# Patient Record
Sex: Male | Born: 1955 | Race: Black or African American | Hispanic: No | Marital: Single | Smoking: Never smoker
Health system: Southern US, Community
[De-identification: ages and names within clinical notes are randomized; demographics above are authoritative.]

## PROBLEM LIST (undated history)

## (undated) DIAGNOSIS — F32A Depression, unspecified: Secondary | ICD-10-CM

## (undated) DIAGNOSIS — D689 Coagulation defect, unspecified: Secondary | ICD-10-CM

## (undated) DIAGNOSIS — K5792 Diverticulitis of intestine, part unspecified, without perforation or abscess without bleeding: Secondary | ICD-10-CM

## (undated) DIAGNOSIS — K219 Gastro-esophageal reflux disease without esophagitis: Secondary | ICD-10-CM

## (undated) DIAGNOSIS — F419 Anxiety disorder, unspecified: Secondary | ICD-10-CM

## (undated) DIAGNOSIS — K649 Unspecified hemorrhoids: Secondary | ICD-10-CM

## (undated) HISTORY — DX: Gastro-esophageal reflux disease without esophagitis: K21.9

## (undated) HISTORY — DX: Anxiety disorder, unspecified: F41.9

## (undated) HISTORY — PX: HAND SURGERY: SHX662

## (undated) HISTORY — DX: Depression, unspecified: F32.A

## (undated) HISTORY — DX: Diverticulitis of intestine, part unspecified, without perforation or abscess without bleeding: K57.92

## (undated) HISTORY — DX: Coagulation defect, unspecified: D68.9

---

## 1988-06-20 HISTORY — PX: LIVER SURGERY: SHX698

## 1992-06-20 HISTORY — PX: COLONOSCOPY: SHX174

## 1998-09-18 ENCOUNTER — Emergency Department (HOSPITAL_COMMUNITY): Admission: EM | Admit: 1998-09-18 | Discharge: 1998-09-18 | Payer: Self-pay | Admitting: Emergency Medicine

## 1998-09-24 ENCOUNTER — Emergency Department (HOSPITAL_COMMUNITY): Admission: EM | Admit: 1998-09-24 | Discharge: 1998-09-24 | Payer: Self-pay | Admitting: Emergency Medicine

## 1999-06-21 HISTORY — PX: COLONOSCOPY: SHX174

## 1999-07-21 ENCOUNTER — Encounter: Admission: RE | Admit: 1999-07-21 | Discharge: 1999-07-21 | Payer: Self-pay | Admitting: Family Medicine

## 1999-08-02 ENCOUNTER — Encounter: Admission: RE | Admit: 1999-08-02 | Discharge: 1999-08-02 | Payer: Self-pay | Admitting: Family Medicine

## 1999-09-02 ENCOUNTER — Ambulatory Visit (HOSPITAL_COMMUNITY): Admit: 1999-09-02 | Discharge: 1999-09-02 | Payer: Self-pay | Admitting: Gastroenterology

## 2000-02-22 ENCOUNTER — Encounter: Admission: RE | Admit: 2000-02-22 | Discharge: 2000-02-22 | Payer: Self-pay | Admitting: Family Medicine

## 2000-10-20 ENCOUNTER — Encounter: Admission: RE | Admit: 2000-10-20 | Discharge: 2000-10-20 | Payer: Self-pay | Admitting: Family Medicine

## 2000-12-19 ENCOUNTER — Encounter: Payer: Self-pay | Admitting: Emergency Medicine

## 2000-12-19 ENCOUNTER — Emergency Department (HOSPITAL_COMMUNITY): Admission: EM | Admit: 2000-12-19 | Discharge: 2000-12-19 | Payer: Self-pay | Admitting: Emergency Medicine

## 2011-06-21 HISTORY — PX: COLONOSCOPY: SHX174

## 2012-07-23 ENCOUNTER — Emergency Department (HOSPITAL_COMMUNITY): Payer: Self-pay

## 2012-07-23 ENCOUNTER — Encounter (HOSPITAL_COMMUNITY): Payer: Self-pay | Admitting: Emergency Medicine

## 2012-07-23 ENCOUNTER — Emergency Department (HOSPITAL_COMMUNITY)
Admission: EM | Admit: 2012-07-23 | Discharge: 2012-07-23 | Disposition: A | Discharge status: Home / Self Care | Payer: Self-pay | Attending: Emergency Medicine | Admitting: Emergency Medicine

## 2012-07-23 DIAGNOSIS — Y929 Unspecified place or not applicable: Secondary | ICD-10-CM | POA: Insufficient documentation

## 2012-07-23 DIAGNOSIS — Y9389 Activity, other specified: Secondary | ICD-10-CM | POA: Insufficient documentation

## 2012-07-23 DIAGNOSIS — S86912A Strain of unspecified muscle(s) and tendon(s) at lower leg level, left leg, initial encounter: Secondary | ICD-10-CM

## 2012-07-23 DIAGNOSIS — W1809XA Striking against other object with subsequent fall, initial encounter: Secondary | ICD-10-CM | POA: Insufficient documentation

## 2012-07-23 DIAGNOSIS — IMO0002 Reserved for concepts with insufficient information to code with codable children: Secondary | ICD-10-CM | POA: Insufficient documentation

## 2012-07-23 MED ORDER — HYDROCODONE-ACETAMINOPHEN 5-325 MG PO TABS: 1.0000 | ORAL_TABLET | Freq: Four times a day (QID) | ORAL | Status: DC | PRN

## 2012-07-23 NOTE — ED Notes (Signed)
Pt c/o left knee pain after hitting knee on wall today when moving a bed

## 2012-07-23 NOTE — Progress Notes (Signed)
Orthopedic Tech Progress Note Patient Details:  Kevin Mckinney 04/20/1956 [id number]  Ortho Devices Type of Ortho Device: Crutches;Knee Immobilizer Ortho Device/Splint Location: (L) LE Ortho Device/Splint Interventions: Application   Jennye Moccasin 07/23/2012, 6:28 PM

## 2012-07-23 NOTE — ED Notes (Signed)
Ortho tech called for orders 

## 2012-07-23 NOTE — ED Provider Notes (Signed)
History  This chart was scribed for Shelda Jakes, MD by Erskine Emery, ED Scribe. This patient was seen in room TR08C/TR08C and the patient's care was started at 16:45.   CSN: [id number]  Arrival date & time 07/23/12  1613   First MD Initiated Contact with Patient 07/23/12 1645      Chief Complaint  Patient presents with  . Knee Pain    (Consider location/radiation/quality/duration/timing/severity/associated sxs/prior treatment) The history is provided by the patient. No language interpreter was used.  Kevin Mckinney is a 57 y.o. male who presents to the Emergency Department complaining of throbbing, 10/10 left lateral knee pain since 8:30am this morning when he was moving a bed down stairs, fell, and hit his left knee on the wall, making a hole in the wall. Pt reports now he can no longer stand or ambulate well on the leg. Pt denies any previous injuries in that area, any knee sweling, or any left foot numbness.  History reviewed. No pertinent past medical history.  History reviewed. No pertinent past surgical history.  History reviewed. No pertinent family history.  History  Substance Use Topics  . Smoking status: Never Smoker   . Smokeless tobacco: Not on file  . Alcohol Use: No      Review of Systems  Constitutional: Negative for fever.  HENT: Negative for congestion.   Eyes: Negative for visual disturbance.  Respiratory: Negative for cough.   Cardiovascular: Negative for leg swelling.  Gastrointestinal: Negative for abdominal pain.  Genitourinary: Negative for dysuria.  Musculoskeletal: Negative for joint swelling.       Left knee pain  Skin: Negative for rash.  Neurological: Negative for numbness.  Psychiatric/Behavioral: Negative for sleep disturbance.  All other systems reviewed and are negative.    Allergies  Review of patient's allergies indicates no known allergies.  Home Medications   Current Outpatient Rx  Name  Route  Sig  Dispense  Refill   . HYDROCODONE-ACETAMINOPHEN 5-325 MG PO TABS   Oral   Take 1-2 tablets by mouth every 6 (six) hours as needed for pain.   15 tablet   0     Triage Vitals: BP 149/86  Pulse 93  Temp 98.1 F (36.7 C) (Oral)  Resp 18  SpO2 100%  Physical Exam  Nursing note and vitals reviewed. Constitutional: He is oriented to person, place, and time. He appears well-developed and well-nourished. No distress.  HENT:  Head: Normocephalic and atraumatic.  Eyes: EOM are normal. Pupils are equal, round, and reactive to light.  Neck: Neck supple. No tracheal deviation present.  Cardiovascular: Normal rate, regular rhythm and normal heart sounds.   Pulmonary/Chest: Effort normal and breath sounds normal. No respiratory distress. He has no wheezes.       Lungs are clear  Abdominal: Soft. Bowel sounds are normal. He exhibits no distension.  Musculoskeletal: Normal range of motion. He exhibits no edema.       No knee effusion. DP pulse 2+ on left. No calf tenderness. Patella is in proprer location, with no dislocation. No medial tenderness, no joint line tenderness, only left lateral knee tenderness.  Neurological: He is alert and oriented to person, place, and time.  Skin: Skin is warm and dry.  Psychiatric: He has a normal mood and affect.    ED Course  Procedures (including critical care time) DIAGNOSTIC STUDIES: Oxygen Saturation is 100% on room air, normal by my interpretation.    COORDINATION OF CARE: 16:56--I evaluated the patient and we  discussed a treatment plan including knee x-ray and knee immobilizer to which the pt agreed.    Labs Reviewed - No data to display Dg Knee Complete 4 Views Left  07/23/2012  *RADIOLOGY REPORT*  Clinical Data: Left knee pain.  LEFT KNEE - COMPLETE 4+ VIEW  Comparison: None.  Findings: Four views of the left knee were obtained.  Negative for acute fracture or dislocation.  No definite joint effusion.  Normal alignment.  Mild degenerative changes in the lateral  knee compartment and patellofemoral compartment.  IMPRESSION: No acute bony abnormality.   Original Report Authenticated By: Richarda Overlie, M.D.      1. Strain of knee and leg, left       MDM  Patient with a strain to the left knee. No evidence of effusion doubt any internal joint injury. May have a the lateral collateral ligament injury or tendon injury. Patient retrieve a knee immobilizer and crutches and followup with orthopedics.      I personally performed the services described in this documentation, which was scribed in my presence. The recorded information has been reviewed and is accurate.     Shelda Jakes, MD 07/23/12 [id number]

## 2015-03-12 ENCOUNTER — Emergency Department (HOSPITAL_COMMUNITY)
Admission: EM | Admit: 2015-03-12 | Discharge: 2015-03-12 | Disposition: A | Discharge status: Home / Self Care | Payer: Self-pay | Attending: Emergency Medicine | Admitting: Emergency Medicine

## 2015-03-12 ENCOUNTER — Encounter (HOSPITAL_COMMUNITY): Payer: Self-pay | Admitting: *Deleted

## 2015-03-12 ENCOUNTER — Emergency Department (HOSPITAL_COMMUNITY): Payer: Self-pay

## 2015-03-12 DIAGNOSIS — R079 Chest pain, unspecified: Secondary | ICD-10-CM | POA: Insufficient documentation

## 2015-03-12 DIAGNOSIS — R0602 Shortness of breath: Secondary | ICD-10-CM | POA: Insufficient documentation

## 2015-03-12 DIAGNOSIS — M79602 Pain in left arm: Secondary | ICD-10-CM | POA: Insufficient documentation

## 2015-03-12 DIAGNOSIS — M546 Pain in thoracic spine: Secondary | ICD-10-CM | POA: Insufficient documentation

## 2015-03-12 LAB — CBC WITH DIFFERENTIAL/PLATELET
Basophils Absolute: 0.1 10*3/uL (ref 0.0–0.1)
Basophils Relative: 1 %
EOS ABS: 0.1 10*3/uL (ref 0.0–0.7)
Eosinophils Relative: 1 %
HCT: 39.4 % (ref 39.0–52.0)
HEMOGLOBIN: 13.1 g/dL (ref 13.0–17.0)
LYMPHS ABS: 2.3 10*3/uL (ref 0.7–4.0)
Lymphocytes Relative: 35 %
MCH: 29 pg (ref 26.0–34.0)
MCHC: 33.2 g/dL (ref 30.0–36.0)
MCV: 87.4 fL (ref 78.0–100.0)
MONO ABS: 0.3 10*3/uL (ref 0.1–1.0)
MONOS PCT: 5 %
NEUTROS PCT: 58 %
Neutro Abs: 3.7 10*3/uL (ref 1.7–7.7)
Platelets: 205 10*3/uL (ref 150–400)
RBC: 4.51 MIL/uL (ref 4.22–5.81)
RDW: 12.8 % (ref 11.5–15.5)
WBC: 6.4 10*3/uL (ref 4.0–10.5)

## 2015-03-12 LAB — COMPREHENSIVE METABOLIC PANEL
ALK PHOS: 74 U/L (ref 38–126)
ALT: 19 U/L (ref 17–63)
ANION GAP: 5 (ref 5–15)
AST: 23 U/L (ref 15–41)
Albumin: 3.5 g/dL (ref 3.5–5.0)
BILIRUBIN TOTAL: 0.4 mg/dL (ref 0.3–1.2)
BUN: 15 mg/dL (ref 6–20)
CALCIUM: 8.6 mg/dL — AB (ref 8.9–10.3)
CO2: 27 mmol/L (ref 22–32)
Chloride: 105 mmol/L (ref 101–111)
Creatinine, Ser: 1.15 mg/dL (ref 0.61–1.24)
GFR calc non Af Amer: >60 mL/min (ref >60)
Glucose, Bld: 104 mg/dL — ABNORMAL HIGH (ref 65–99)
Potassium: 3.7 mmol/L (ref 3.5–5.1)
SODIUM: 137 mmol/L (ref 135–145)
TOTAL PROTEIN: 6.3 g/dL — AB (ref 6.5–8.1)

## 2015-03-12 LAB — LIPASE, BLOOD: Lipase: 43 U/L (ref 22–51)

## 2015-03-12 LAB — I-STAT TROPONIN, ED: Troponin i, poc: 0 ng/mL (ref 0.00–0.08)

## 2015-03-12 LAB — CK: CK TOTAL: 315 U/L (ref 49–397)

## 2015-03-12 LAB — D-DIMER, QUANTITATIVE (NOT AT ARMC)

## 2015-03-12 MED ORDER — HYDROCODONE-ACETAMINOPHEN 5-325 MG PO TABS
2.0000 | ORAL_TABLET | Freq: Four times a day (QID) | ORAL | Status: DC | PRN
Start: 2015-03-12 — End: 2017-07-17

## 2015-03-12 MED ORDER — ASPIRIN 81 MG PO CHEW
324.0000 mg | CHEWABLE_TABLET | Freq: Once | ORAL | Status: AC
  Administered 2015-03-12: 324 mg via ORAL
  Filled 2015-03-12: qty 4

## 2015-03-12 MED ORDER — ACYCLOVIR 800 MG PO TABS: 800.0000 mg | ORAL_TABLET | Freq: Every day | ORAL | Status: DC

## 2015-03-12 MED ORDER — NITROGLYCERIN 0.4 MG SL SUBL: 0.4000 mg | SUBLINGUAL_TABLET | SUBLINGUAL | Status: DC | PRN

## 2015-03-12 MED ORDER — CYCLOBENZAPRINE HCL 10 MG PO TABS: 10.0000 mg | ORAL_TABLET | Freq: Two times a day (BID) | ORAL | Status: DC | PRN

## 2015-03-12 NOTE — ED Notes (Addendum)
Pt in c/o pain to left shoulder blade for the last month, states some times it runs down into his hand, denies chest pain, states at times the pain makes it hard to catch his breath, pain is worse with movement, pain in neck and it hurts to turn his head, no distress noted, denies injury

## 2015-03-12 NOTE — ED Provider Notes (Signed)
CSN: [id number]     Arrival date & time 03/12/15  1418 History   First MD Initiated Contact with Patient 03/12/15 1527     Chief Complaint  Patient presents with  . Shoulder Pain    Patient is a 59 y.o. male presenting with general illness. The history is provided by the patient. No language interpreter was used.  Illness Location:  Shoulder/Chest Quality:  Pain Severity:  Moderate Onset quality:  Gradual Timing:  Intermittent Progression:  Waxing and waning Chronicity:  New Context:  PMHx of HTN (not medicated) with no PCP who presents with shoulder & L sided CP. Onset x3-4 weeks ago. Persistent pain intermittently throughout day. Sharp pain radiating around from mid thoracic back to left lateral chest. Worse with deep inspiration, lifting left arm, and lying down at night. Pain, numbness, & tingling radiating down left arm now.  Patient denies hemoptysis, unilateral lower extremities swelling, recent prolonged car ride or airplane ride or other immobilization, recent trauma or surgery Associated symptoms: chest pain, myalgias and shortness of breath   Associated symptoms: no cough, no diarrhea, no fever, no headaches, no loss of consciousness, no nausea, no rash, no vomiting and no wheezing     History reviewed. No pertinent past medical history. History reviewed. No pertinent past surgical history. History reviewed. No pertinent family history. Social History  Substance Use Topics  . Smoking status: Never Smoker   . Smokeless tobacco: None  . Alcohol Use: No    Review of Systems  Constitutional: Negative for fever and chills.  Respiratory: Positive for shortness of breath. Negative for cough and wheezing.   Cardiovascular: Positive for chest pain. Negative for palpitations and leg swelling.  Gastrointestinal: Negative for nausea, vomiting and diarrhea.  Musculoskeletal: Positive for myalgias, back pain and arthralgias.  Skin: Negative for rash.  Neurological: Positive for  numbness. Negative for dizziness, tremors, seizures, loss of consciousness, syncope, facial asymmetry, speech difficulty, weakness, light-headedness and headaches.  All other systems reviewed and are negative.   Allergies  Review of patient's allergies indicates no known allergies.  Home Medications   Prior to Admission medications   Medication Sig Start Date End Date Taking? Authorizing Provider  acyclovir (ZOVIRAX) 800 MG tablet Take 1 tablet (800 mg total) by mouth 5 (five) times daily. 03/12/15   Mayer Camel, MD  cyclobenzaprine (FLEXERIL) 10 MG tablet Take 1 tablet (10 mg total) by mouth 2 (two) times daily as needed for muscle spasms. 03/12/15   Mayer Camel, MD  HYDROcodone-acetaminophen (NORCO/VICODIN) 5-325 MG per tablet Take 2 tablets by mouth every 6 (six) hours as needed for moderate pain. 03/12/15   Mayer Camel, MD   BP 148/92 mmHg  Pulse 51  Temp(Src) 98.4 F (36.9 C) (Oral)  Resp 16  SpO2 99%   Physical Exam  Constitutional: He is oriented to person, place, and time. He appears well-developed and well-nourished. No distress.  HENT:  Head: Normocephalic and atraumatic.  Eyes: Conjunctivae are normal. Pupils are equal, round, and reactive to light.  Neck: Normal range of motion. Neck supple.  Cardiovascular: Normal rate, regular rhythm and intact distal pulses.   Hypertensive  Pulmonary/Chest: Effort normal and breath sounds normal.  Abdominal: Soft. Bowel sounds are normal. He exhibits no distension. There is no tenderness. There is no rebound.  Musculoskeletal: Normal range of motion. He exhibits tenderness.  Tenderness to palpation of entire left paraspinal muscles from level of left trapezium to just below left rhomboid,  Tenderness to palpation in bandlike distribution  around the left lateral chest  Neurological: He is alert and oriented to person, place, and time. He has normal reflexes. He displays no atrophy, no tremor and normal reflexes. No cranial nerve  deficit or sensory deficit. He exhibits normal muscle tone. He displays no seizure activity. Coordination and gait normal. GCS eye subscore is 4. GCS verbal subscore is 5. GCS motor subscore is 6.  Reflex Scores:      Tricep reflexes are 2+ on the right side and 2+ on the left side.      Bicep reflexes are 2+ on the right side and 2+ on the left side.      Brachioradialis reflexes are 2+ on the right side and 2+ on the left side.      Patellar reflexes are 2+ on the right side and 2+ on the left side.      Achilles reflexes are 2+ on the right side and 2+ on the left side. Skin: Skin is warm. No rash noted. He is diaphoretic.  Nursing note and vitals reviewed.   ED Course  Procedures   Labs Review Labs Reviewed  COMPREHENSIVE METABOLIC PANEL - Abnormal; Notable for the following:    Glucose, Bld 104 (*)    Calcium 8.6 (*)    Total Protein 6.3 (*)    All other components within normal limits  CBC WITH DIFFERENTIAL/PLATELET  LIPASE, BLOOD  D-DIMER, QUANTITATIVE (NOT AT [facility])  CK  I-STAT TROPOININ, ED   Imaging Review Dg Chest 2 View  03/12/2015   CLINICAL DATA:  Left arm pain, mid thoracic pain  EXAM: CHEST  2 VIEW  COMPARISON:  None.  FINDINGS: Cardiomediastinal silhouette is unremarkable. No acute infiltrate or pleural effusion. No pulmonary edema. Degenerative changes mid thoracic spine.  IMPRESSION: No active cardiopulmonary disease.   Electronically Signed   By: Lahoma Crocker M.D.   On: 03/12/2015 16:32   Dg Thoracic Spine 2 View  03/12/2015   CLINICAL DATA:  Left arm pain, mid thoracic spine pain  EXAM: THORACIC SPINE 2 VIEWS  COMPARISON:  None.  FINDINGS: Three views of thoracic spine submitted. No acute fracture or subluxation. Degenerative changes with anterior spurring noted mid thoracic spine. Alignment and vertebral body heights are preserved.  IMPRESSION: No acute fracture or subluxation. Degenerative changes mid thoracic spine.   Electronically Signed   By: Lahoma Crocker M.D.    On: 03/12/2015 16:31   I have personally reviewed and evaluated these images and lab results as part of my medical decision-making.   EKG Interpretation   Date/Time:  Thursday March 12 2015 15:12:37 EDT Ventricular Rate:  65 PR Interval:  156 QRS Duration: 80 QT Interval:  374 QTC Calculation: 388 R Axis:   23 Text Interpretation:  Normal sinus rhythm T wave abnormality, consider  inferior ischemia Abnormal ECG T wave inversion seen in III in 2002, aVF  is new No significant change since last tracing Confirmed by Mingo Amber  MD,  Daniels ([id number]) on 03/12/2015 4:51:26 PM      MDM  Mr. Servantes is a 59 yo male w/ PMHx of HTN (not medicated) with no PCP who presents with shoulder & L sided CP. Onset x3-4 weeks ago. Persistent pain intermittently throughout day. Sharp pain radiating around from mid thoracic back to left lateral chest. Worse with deep inspiration, lifting left arm, and lying down at night. Pain, numbness, & tingling radiating down left arm now. Patient right handed but notes recent increased activity at work with heavy lifting.  Patient denies hemoptysis, unilateral lower extremities swelling, recent prolonged car ride or airplane ride or other immobilization, recent trauma or surgery to either lower extremity, or previous history of DVT/PE.  Elderly male lying in the stretcher in NAD. Afebrile. Not tachycardic. Hypertensive. Breathing well on RA and maintaining saturations without supplemental oxygen. Lungs CTAB. Abd benign. CV RRR. Remainder of examination unremarkable.  Differential including ACS versus PE versus PTX versus muculoskeletal pain versus shingles.  Patient given ASA & NO SL prn ordered. EKG showing questionable elevation in aVL &V2 with T-wave inversion in 3 and aVF. I-STAT troponin undetectable - no need for delta troponin given duration of symptoms. Patient low risk Well's Criteria but cannot undergo PERC rule out given age. D-dimer <0.27. CK 315. CMP & CBC  noncontributory. Two-view x-ray thoracic spine showing no acute fracture or subluxation but degenerative changes with anterior spurring. Chest x-ray showing no acute cardiac pulmonary disease.  Laboratory and imaging results were personally reviewed by myself and used in the medical decision making of this patient's treatment and disposition.  Pain likely related to muscular skeletal origin versus early shingles without dermatologic manifestations yet. Patient would benefit form regular care from PCP given borderline hypertension diagnosed while incarcerated. Patient given resources to set this up. Patient discharged home with regimen for Acyclovir, Flexeril, and Norco.  Pt care discussed with and followed by my attending, Dr. Donnald Garre, MD Pager [phone]  Final diagnoses:  Left sided chest pain  Left-sided thoracic back pain  Left arm pain    Mayer Camel, MD 03/12/15 1759  Evelina Bucy, MD 03/12/15 2326

## 2016-10-13 IMAGING — DX DG CHEST 2V
2 series · 2 of 2 positions shown · non-contrast
Comparison: None.

CLINICAL DATA: Left arm pain, mid thoracic pain

EXAM:
CHEST  2 VIEW

[chest pa]
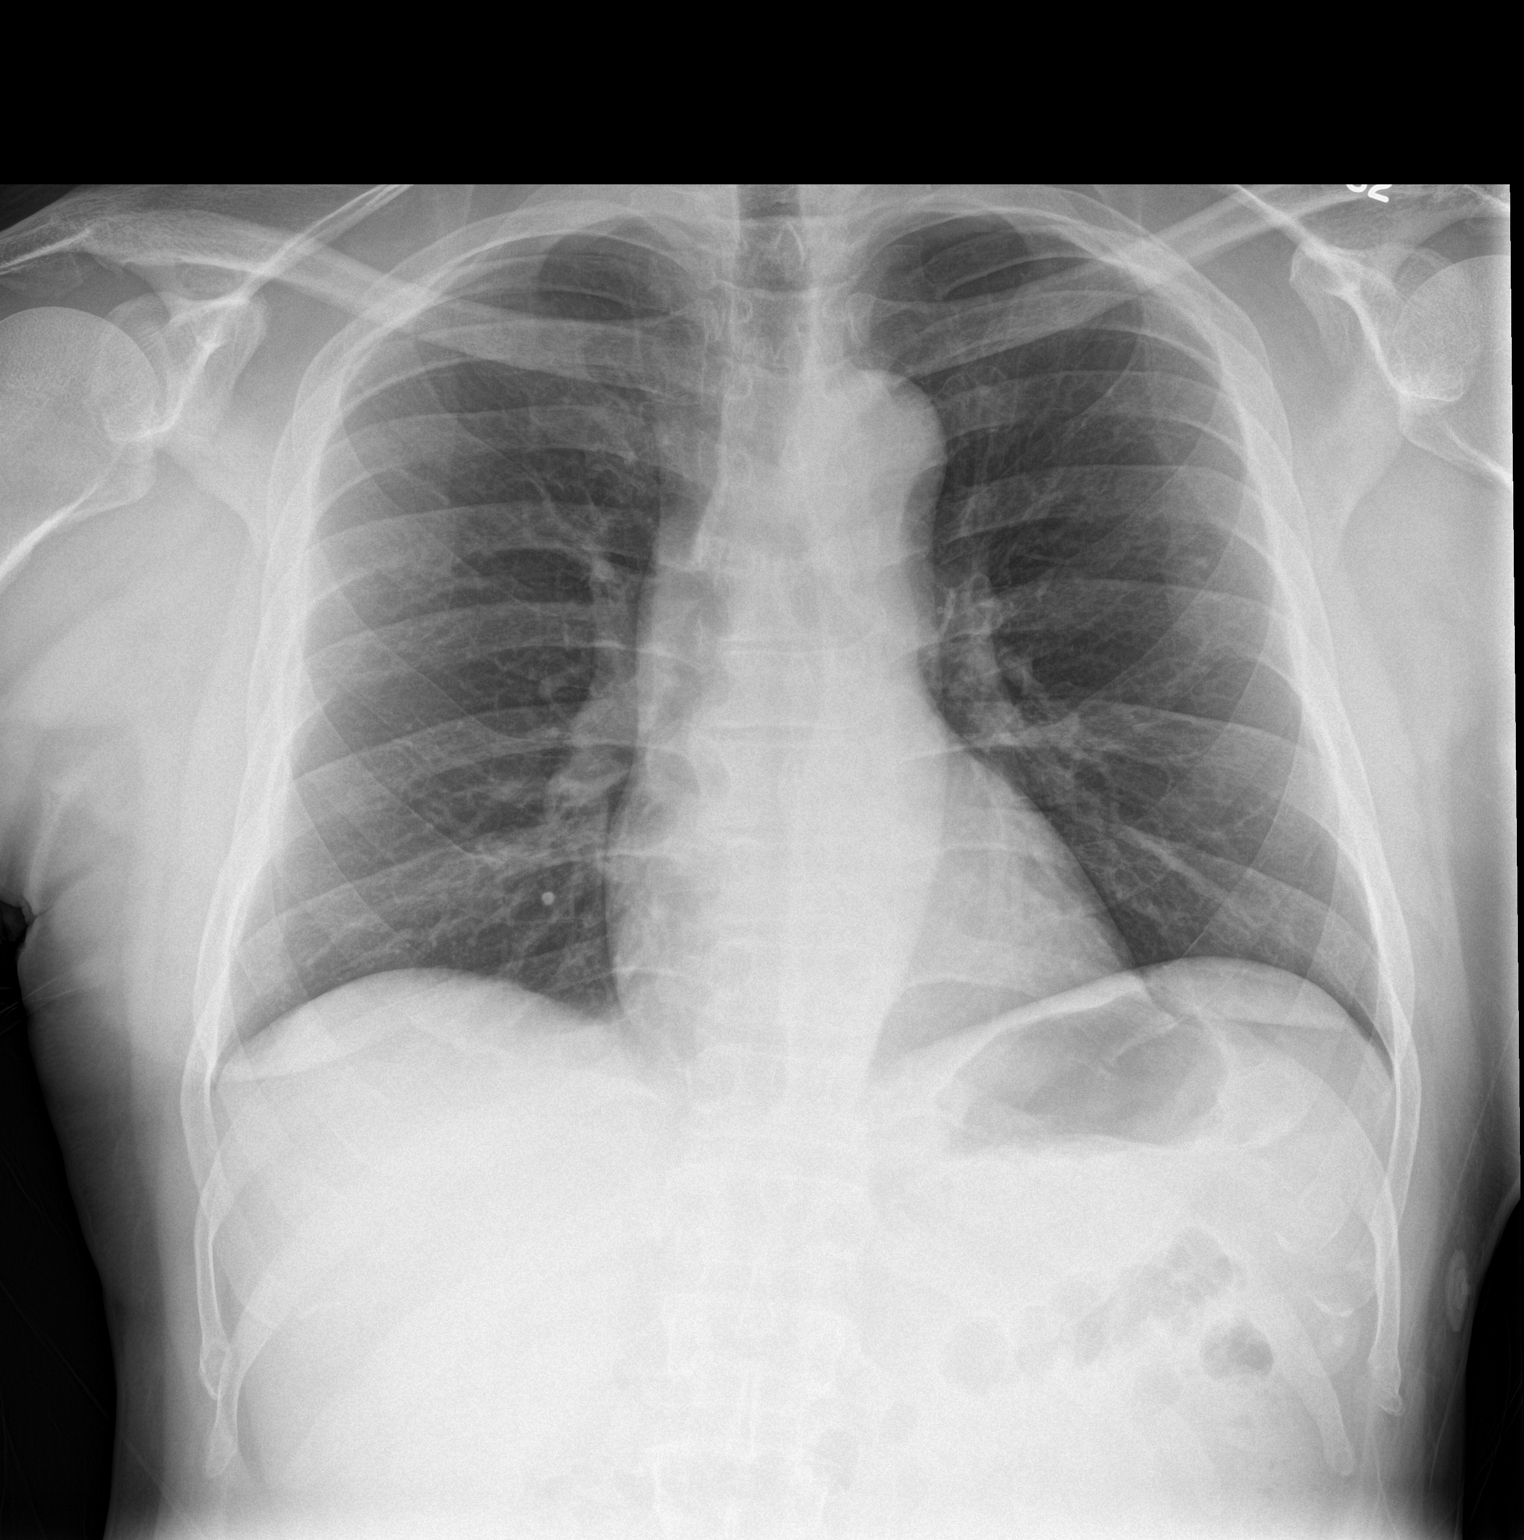

[chest lat]
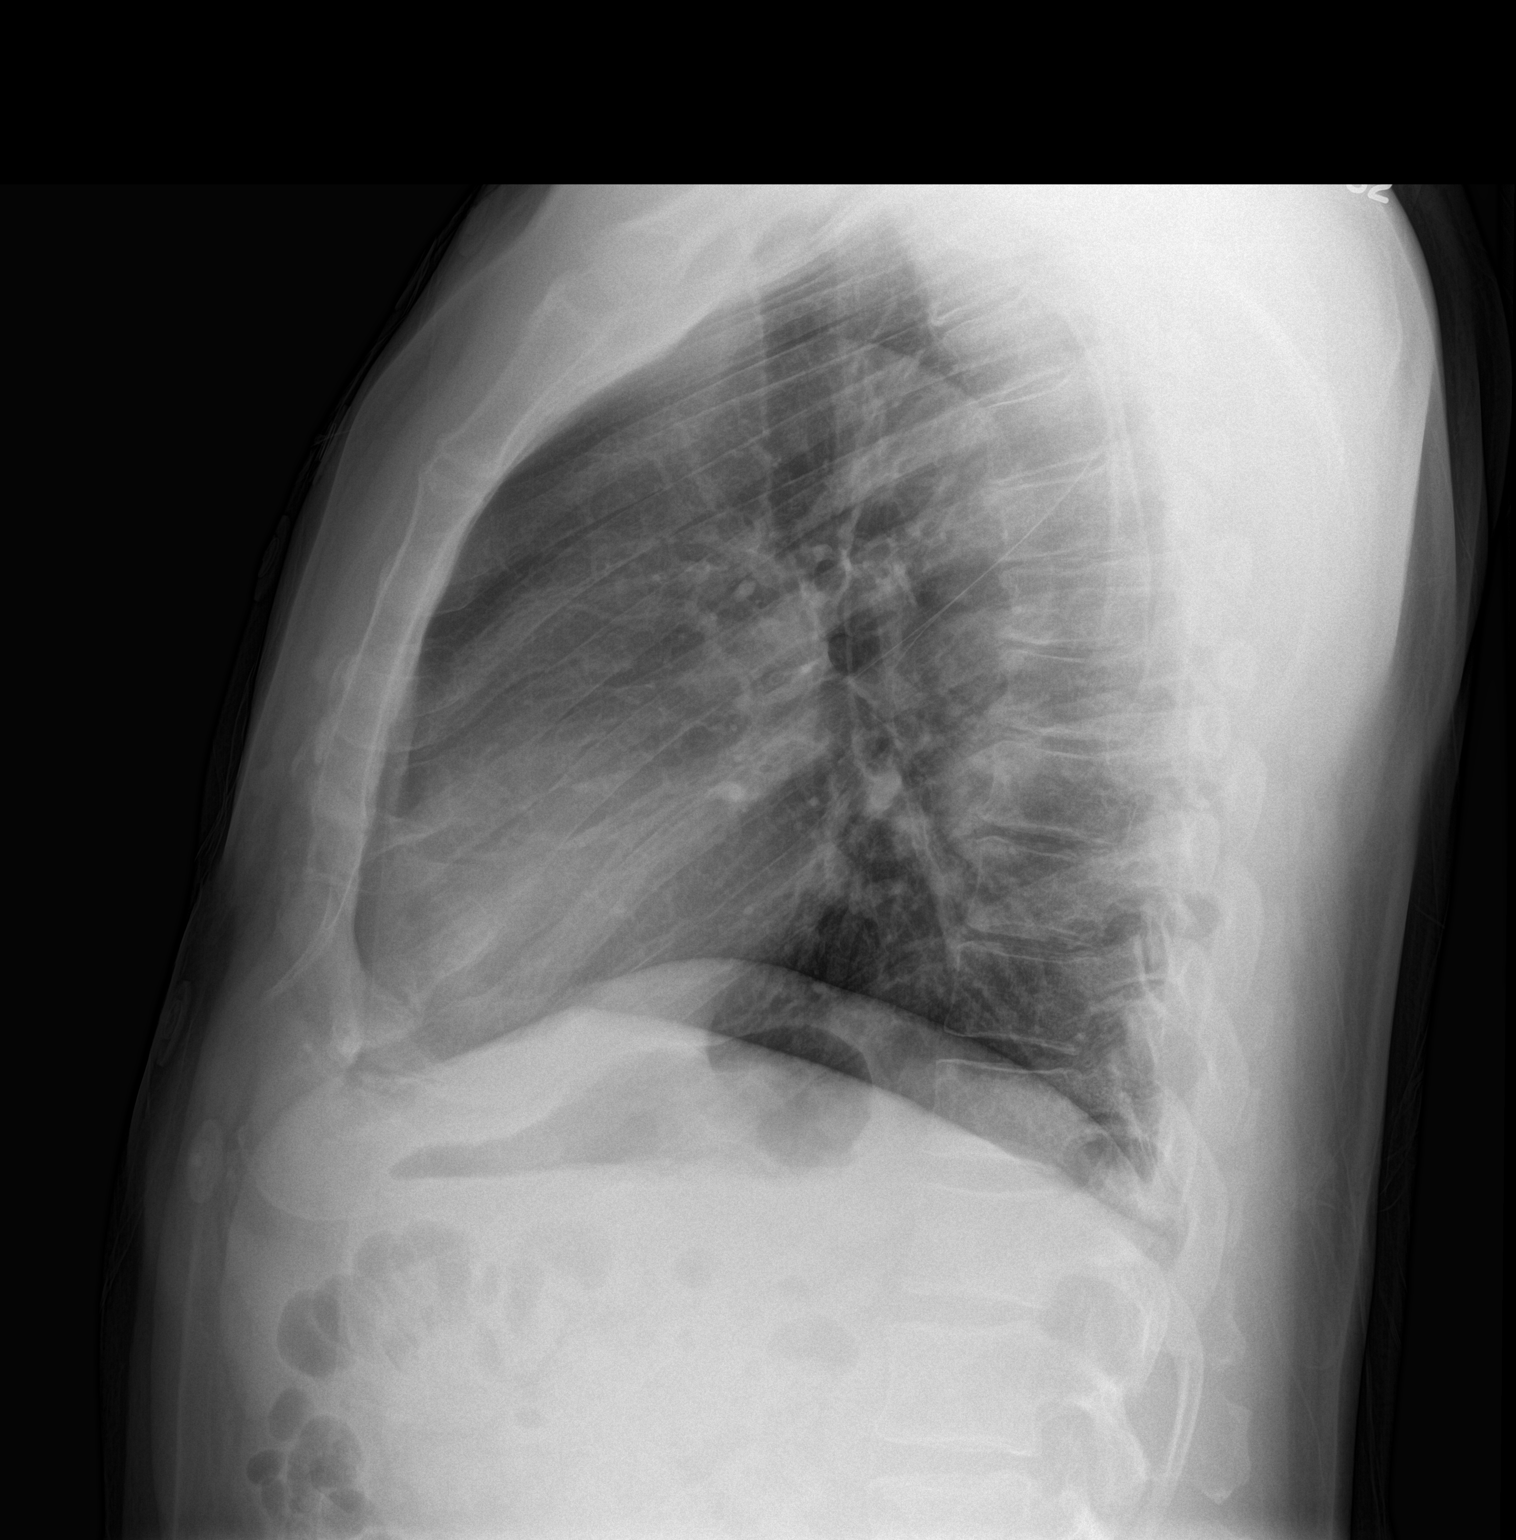

[2 of 2 positions shown; findings below may reference images not displayed]

FINDINGS: Cardiomediastinal silhouette is unremarkable. No acute infiltrate or
pleural effusion. No pulmonary edema. Degenerative changes mid
thoracic spine.
IMPRESSION: No active cardiopulmonary disease.

## 2016-10-13 IMAGING — DX DG THORACIC SPINE 2V
3 series · 3 of 3 positions shown · non-contrast
Comparison: None.

CLINICAL DATA: Left arm pain, mid thoracic spine pain

EXAM:
THORACIC SPINE 2 VIEWS

[t-spine lat]
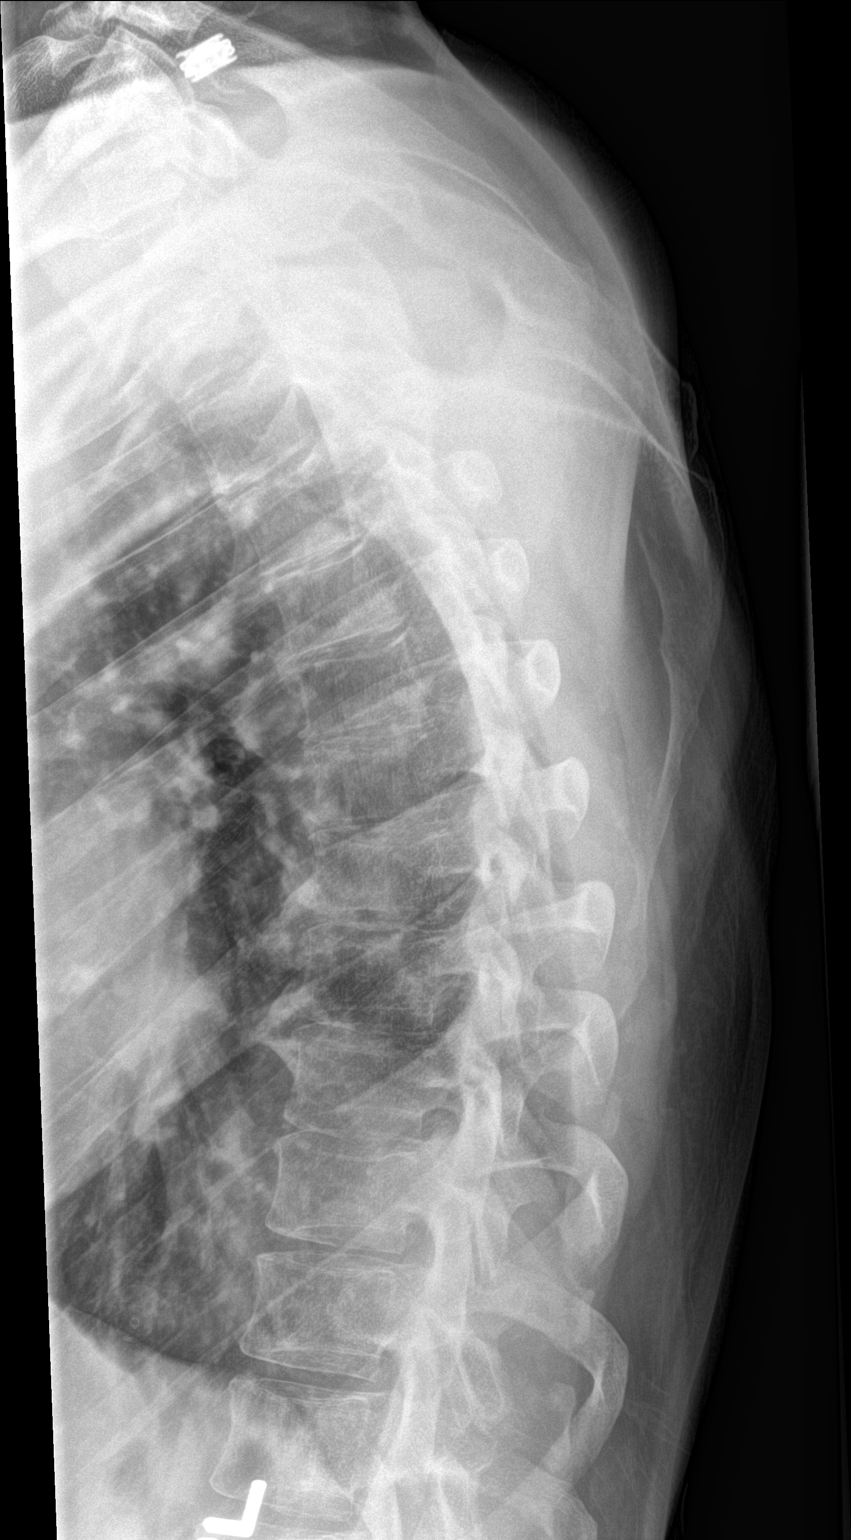

[t-spine swimmers]
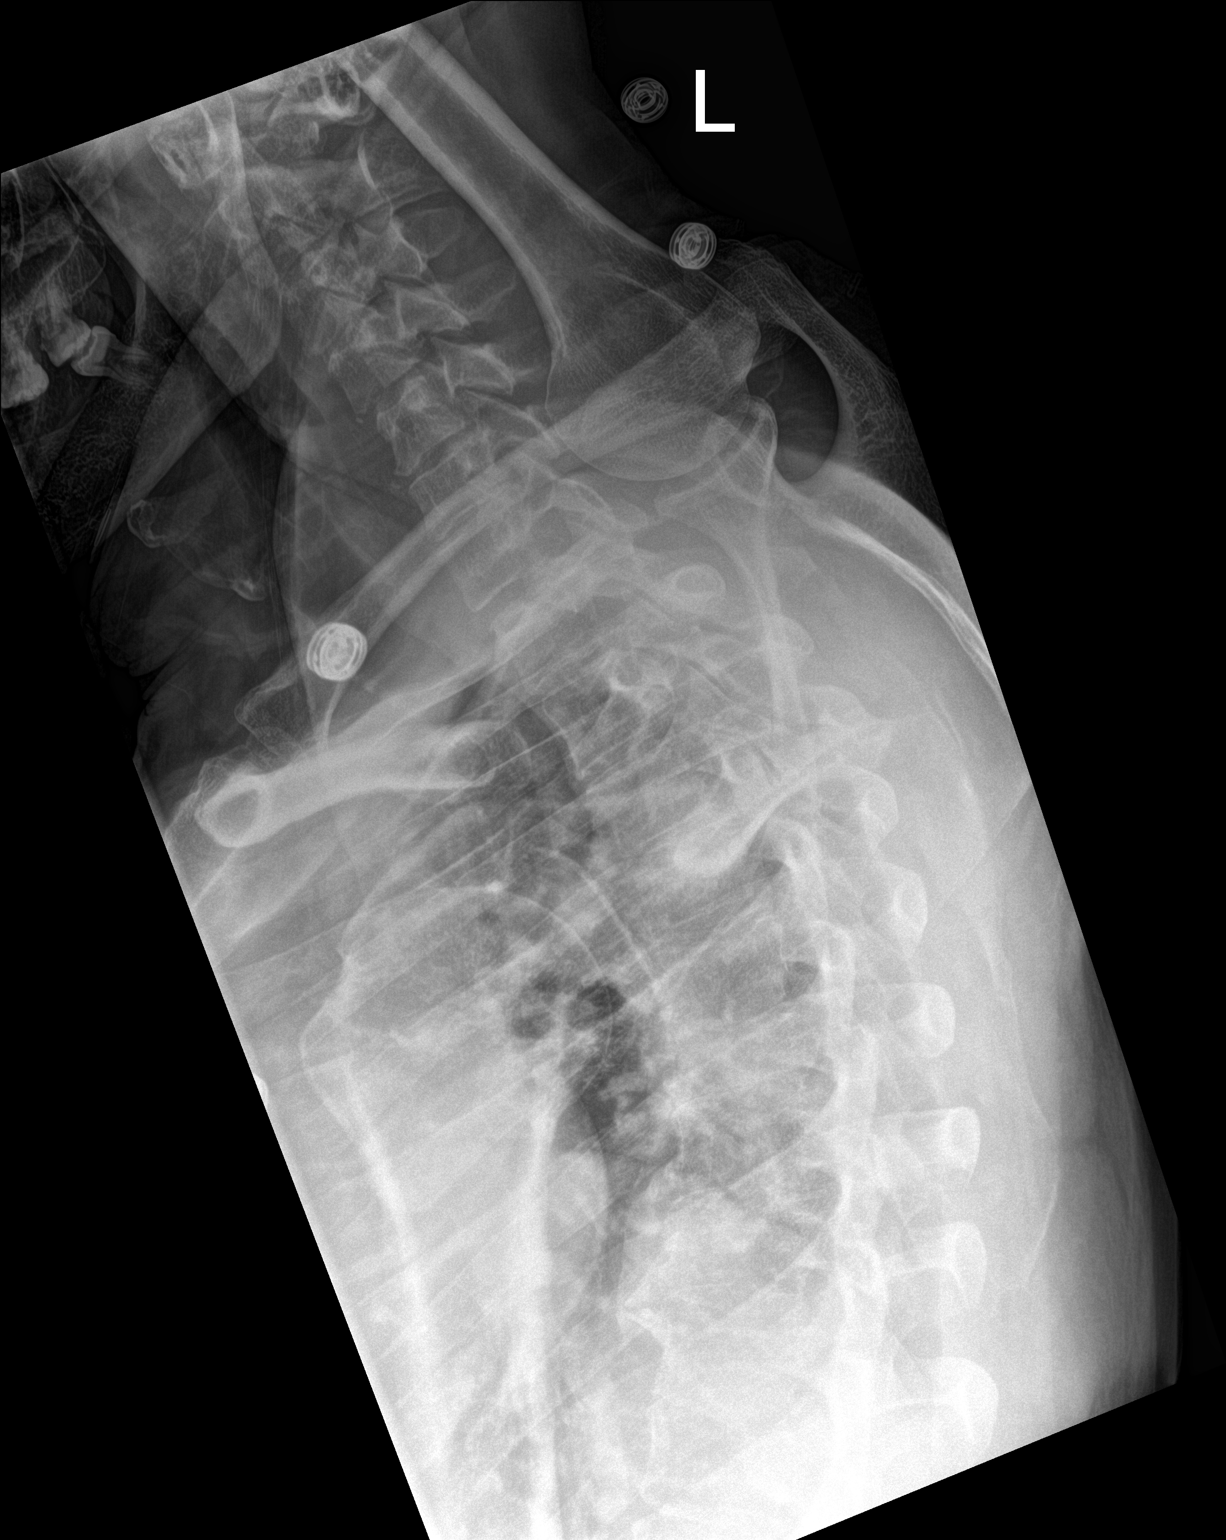

[t-spine ap]
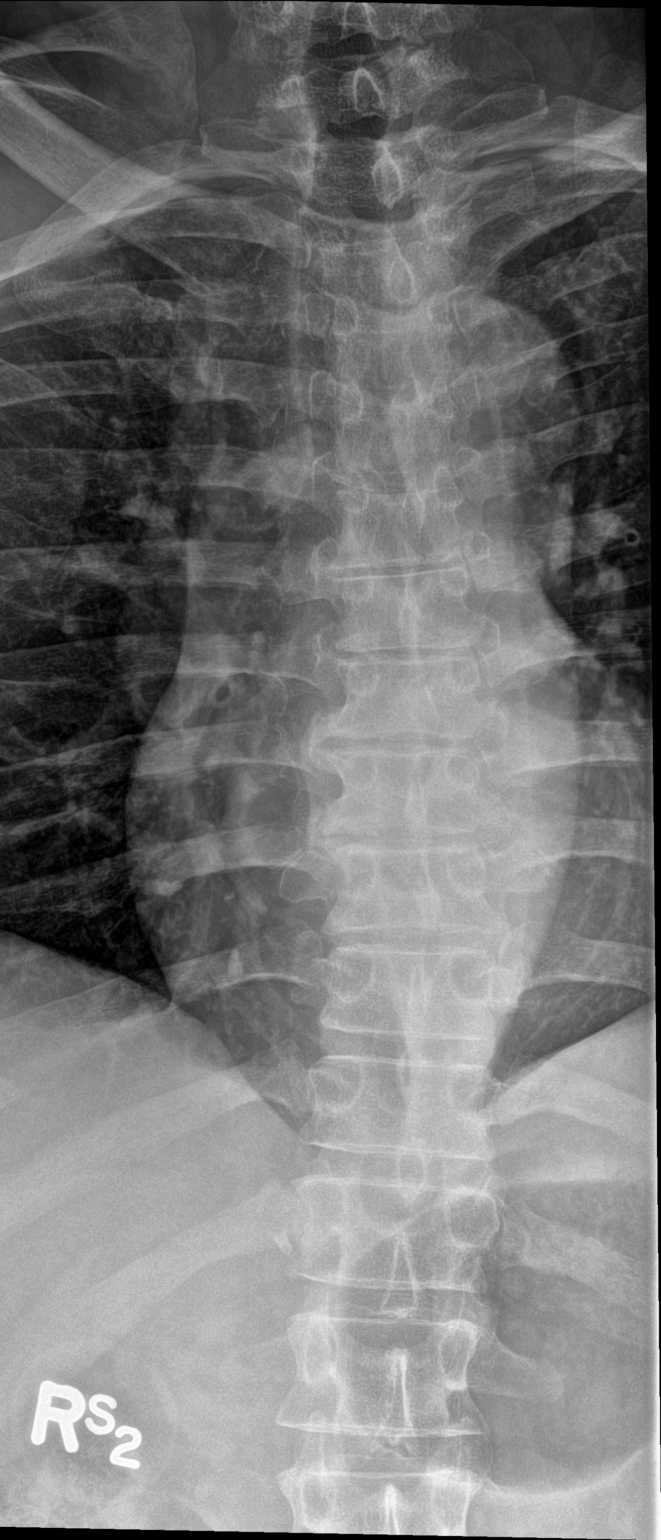

[3 of 3 positions shown; findings below may reference images not displayed]

FINDINGS: Three views of thoracic spine submitted. No acute fracture or
subluxation. Degenerative changes with anterior spurring noted mid
thoracic spine. Alignment and vertebral body heights are preserved.
IMPRESSION: No acute fracture or subluxation. Degenerative changes mid thoracic
spine.

## 2017-07-17 ENCOUNTER — Emergency Department (HOSPITAL_COMMUNITY): Payer: Self-pay

## 2017-07-17 ENCOUNTER — Emergency Department (HOSPITAL_COMMUNITY)
Admission: EM | Admit: 2017-07-17 | Discharge: 2017-07-17 | Disposition: A | Discharge status: Home / Self Care | Payer: Self-pay | Attending: Emergency Medicine | Admitting: Emergency Medicine

## 2017-07-17 ENCOUNTER — Other Ambulatory Visit: Payer: Self-pay

## 2017-07-17 ENCOUNTER — Encounter (HOSPITAL_COMMUNITY): Payer: Self-pay

## 2017-07-17 DIAGNOSIS — Z79899 Other long term (current) drug therapy: Secondary | ICD-10-CM | POA: Insufficient documentation

## 2017-07-17 DIAGNOSIS — K5732 Diverticulitis of large intestine without perforation or abscess without bleeding: Secondary | ICD-10-CM | POA: Insufficient documentation

## 2017-07-17 LAB — URINALYSIS, ROUTINE W REFLEX MICROSCOPIC
BILIRUBIN URINE: NEGATIVE
GLUCOSE, UA: NEGATIVE mg/dL
Hgb urine dipstick: NEGATIVE
KETONES UR: 5 mg/dL — AB
Leukocytes, UA: NEGATIVE
NITRITE: NEGATIVE
PH: 5 (ref 5.0–8.0)
Protein, ur: 30 mg/dL — AB
Specific Gravity, Urine: 1.029 (ref 1.005–1.030)

## 2017-07-17 LAB — COMPREHENSIVE METABOLIC PANEL
ALK PHOS: 72 U/L (ref 38–126)
ALT: 22 U/L (ref 17–63)
ANION GAP: 12 (ref 5–15)
AST: 22 U/L (ref 15–41)
Albumin: 3.7 g/dL (ref 3.5–5.0)
BILIRUBIN TOTAL: 1.2 mg/dL (ref 0.3–1.2)
BUN: 16 mg/dL (ref 6–20)
CALCIUM: 8.9 mg/dL (ref 8.9–10.3)
CO2: 24 mmol/L (ref 22–32)
Chloride: 103 mmol/L (ref 101–111)
Creatinine, Ser: 1.35 mg/dL — ABNORMAL HIGH (ref 0.61–1.24)
GFR calc non Af Amer: 55 mL/min — ABNORMAL LOW (ref >60)
Glucose, Bld: 103 mg/dL — ABNORMAL HIGH (ref 65–99)
Potassium: 3.7 mmol/L (ref 3.5–5.1)
SODIUM: 139 mmol/L (ref 135–145)
Total Protein: 7.2 g/dL (ref 6.5–8.1)

## 2017-07-17 LAB — CBC
HCT: 45.6 % (ref 39.0–52.0)
HEMOGLOBIN: 14.7 g/dL (ref 13.0–17.0)
MCH: 29 pg (ref 26.0–34.0)
MCHC: 32.2 g/dL (ref 30.0–36.0)
MCV: 89.9 fL (ref 78.0–100.0)
Platelets: 210 10*3/uL (ref 150–400)
RBC: 5.07 MIL/uL (ref 4.22–5.81)
RDW: 13.2 % (ref 11.5–15.5)
WBC: 7.6 10*3/uL (ref 4.0–10.5)

## 2017-07-17 LAB — POC OCCULT BLOOD, ED: Fecal Occult Bld: NEGATIVE

## 2017-07-17 LAB — LIPASE, BLOOD: Lipase: 29 U/L (ref 11–51)

## 2017-07-17 MED ORDER — HYDROCODONE-ACETAMINOPHEN 5-325 MG PO TABS: 1.0000 | ORAL_TABLET | Freq: Four times a day (QID) | ORAL | 0 refills | Status: DC | PRN

## 2017-07-17 MED ORDER — SODIUM CHLORIDE 0.9 % IV BOLUS (SEPSIS)
1000.0000 mL | Freq: Once | INTRAVENOUS | Status: AC
  Administered 2017-07-17: 1000 mL via INTRAVENOUS

## 2017-07-17 MED ORDER — METRONIDAZOLE 500 MG PO TABS: 500.0000 mg | ORAL_TABLET | Freq: Two times a day (BID) | ORAL | 0 refills | Status: DC

## 2017-07-17 MED ORDER — METRONIDAZOLE 500 MG PO TABS: 500.0000 mg | ORAL_TABLET | Freq: Three times a day (TID) | ORAL | 0 refills | Status: AC

## 2017-07-17 MED ORDER — CIPROFLOXACIN HCL 500 MG PO TABS
500.0000 mg | ORAL_TABLET | Freq: Once | ORAL | Status: AC
  Administered 2017-07-17: 500 mg via ORAL
  Filled 2017-07-17: qty 1

## 2017-07-17 MED ORDER — IOPAMIDOL (ISOVUE-300) INJECTION 61%
INTRAVENOUS | Status: AC
  Administered 2017-07-17: 100 mL
  Filled 2017-07-17: qty 100

## 2017-07-17 MED ORDER — CIPROFLOXACIN HCL 500 MG PO TABS: 500.0000 mg | ORAL_TABLET | Freq: Two times a day (BID) | ORAL | 0 refills | Status: DC

## 2017-07-17 MED ORDER — ONDANSETRON 4 MG PO TBDP: 4.0000 mg | ORAL_TABLET | Freq: Three times a day (TID) | ORAL | 0 refills | Status: DC | PRN

## 2017-07-17 MED ORDER — METRONIDAZOLE 500 MG PO TABS
500.0000 mg | ORAL_TABLET | Freq: Once | ORAL | Status: AC
  Administered 2017-07-17: 500 mg via ORAL
  Filled 2017-07-17: qty 1

## 2017-07-17 MED ORDER — DICYCLOMINE HCL 20 MG PO TABS: 20.0000 mg | ORAL_TABLET | Freq: Two times a day (BID) | ORAL | 0 refills | Status: DC

## 2017-07-17 NOTE — ED Provider Notes (Signed)
[facility] EMERGENCY DEPARTMENT Provider Note   CSN: [id number] Arrival date & time: 07/17/17  2423     History   Chief Complaint No chief complaint on file.   HPI Kevin Mckinney is a 62 y.o. male.  HPI   Kevin Mckinney is a 62 y.o. male, with a history of hemorrhoids and chronic hematochezia, presenting to the ED with lower abdominal pain beginning two days ago.  States he thinks he ate some questionable food on 1/26 around 1pm. The food had been left out in room temperature for over 24 hours. Within about 30 minutes, patient  Pain begins periumbilical and radiates to the lower abdomen, 9/10, sharp or "like someone's twisting my insides," constant. Accompanied by vomiting, estimates about 30 instances of emesis. No vomiting today. Black stools beginning yesterday. Diarrhea beginning this morning.  No recent antibiotic use or hospitalizations.  Has chronic recurrence of hematochezia with last instance two weeks ago. Was previously evaluated by GI for chronic hematochezia, but last colonoscopy was 10 years ago, was normal, and he is no longer regularly followed by GI. Does have a history of internal hemorrhoids.   Denies fever/chills, urinary complaints, recent hematochezia, or any other complaints.    History reviewed. No pertinent past medical history.  There are no active problems to display for this patient.   History reviewed. No pertinent surgical history.     Home Medications    Prior to Admission medications   Medication Sig Start Date End Date Taking? Authorizing Provider  acyclovir (ZOVIRAX) 800 MG tablet Take 1 tablet (800 mg total) by mouth 5 (five) times daily. 03/12/15   Mayer Camel, MD  ciprofloxacin (CIPRO) 500 MG tablet Take 1 tablet (500 mg total) by mouth 2 (two) times daily. 07/17/17   Shigeo Baugh C, PA-C  cyclobenzaprine (FLEXERIL) 10 MG tablet Take 1 tablet (10 mg total) by mouth 2 (two) times daily as needed for muscle spasms.  03/12/15   Mayer Camel, MD  dicyclomine (BENTYL) 20 MG tablet Take 1 tablet (20 mg total) by mouth 2 (two) times daily. 07/17/17   Ladeidra Borys C, PA-C  HYDROcodone-acetaminophen (NORCO/VICODIN) 5-325 MG tablet Take 1-2 tablets by mouth every 6 (six) hours as needed for severe pain. 07/17/17   Tawan Degroote C, PA-C  metroNIDAZOLE (FLAGYL) 500 MG tablet Take 1 tablet (500 mg total) by mouth 3 (three) times daily for 7 days. 07/17/17 07/24/17  Yazaira Speas C, PA-C  ondansetron (ZOFRAN ODT) 4 MG disintegrating tablet Take 1 tablet (4 mg total) by mouth every 8 (eight) hours as needed for nausea or vomiting. 07/17/17   Lonn Im, Helane Gunther, PA-C    Family History No family history on file.  Social History Social History   Tobacco Use  . Smoking status: Never Smoker  . Smokeless tobacco: Never Used  Substance Use Topics  . Alcohol use: No  . Drug use: No     Allergies   Patient has no known allergies.   Review of Systems Review of Systems  Constitutional: Negative for chills, diaphoresis and fever.  Respiratory: Negative for shortness of breath.   Cardiovascular: Negative for chest pain.  Gastrointestinal: Positive for abdominal pain, diarrhea, nausea and vomiting.       Dark stools  Genitourinary: Negative for dysuria, frequency, scrotal swelling and testicular pain.  All other systems reviewed and are negative.    Physical Exam Updated Vital Signs BP 120/60   Pulse 69   Temp 98.8 F (37.1 C) (  Oral)   Resp 16   SpO2 99%   Physical Exam  Constitutional: He appears well-developed and well-nourished. No distress.  HENT:  Head: Normocephalic and atraumatic.  Eyes: Conjunctivae are normal.  Neck: Neck supple.  Cardiovascular: Normal rate, regular rhythm, normal heart sounds and intact distal pulses.  Pulmonary/Chest: Effort normal and breath sounds normal. No respiratory distress.  Abdominal: Soft. There is tenderness in the right lower quadrant, periumbilical area, suprapubic area and  left lower quadrant. There is no guarding.  Genitourinary: Rectal exam shows guaiac negative stool.  Genitourinary Comments: No external hemorrhoids, fissures, or lesions noted. No gross blood or stool burden. No rectal tenderness. Med tech, Verline Lema, served as chaperone during the rectal exam.  Musculoskeletal: He exhibits no edema.  Lymphadenopathy:    He has no cervical adenopathy.  Neurological: He is alert.  Skin: Skin is warm and dry. He is not diaphoretic.  Psychiatric: He has a normal mood and affect. His behavior is normal.  Nursing note and vitals reviewed.    ED Treatments / Results  Labs (all labs ordered are listed, but only abnormal results are displayed) Labs Reviewed  COMPREHENSIVE METABOLIC PANEL - Abnormal; Notable for the following components:      Result Value   Glucose, Bld 103 (*)    Creatinine, Ser 1.35 (*)    GFR calc non Af Amer 55 (*)    All other components within normal limits  URINALYSIS, ROUTINE W REFLEX MICROSCOPIC - Abnormal; Notable for the following components:   Color, Urine AMBER (*)    Ketones, ur 5 (*)    Protein, ur 30 (*)    Bacteria, UA RARE (*)    Squamous Epithelial / LPF 0-5 (*)    All other components within normal limits  LIPASE, BLOOD  CBC  POC OCCULT BLOOD, ED    EKG  EKG Interpretation None       Radiology Ct Abdomen Pelvis W Contrast  Result Date: 07/17/2017 CLINICAL DATA:  Acute lower abdominal pain. EXAM: CT ABDOMEN AND PELVIS WITH CONTRAST TECHNIQUE: Multidetector CT imaging of the abdomen and pelvis was performed using the standard protocol following bolus administration of intravenous contrast. CONTRAST:  146mL ISOVUE-300 IOPAMIDOL (ISOVUE-300) INJECTION 61% COMPARISON:  None. FINDINGS: Lower chest: No acute abnormality. Hepatobiliary: No gallstones are noted. Small hepatic cysts are noted. Pancreas: Unremarkable. No pancreatic ductal dilatation or surrounding inflammatory changes. Spleen: Normal in size without focal  abnormality. Adrenals/Urinary Tract: Adrenal glands are unremarkable. Kidneys are normal, without renal calculi, focal lesion, or hydronephrosis. Bladder is unremarkable. Stomach/Bowel: Stomach and appendix appear normal. There is no evidence of bowel obstruction. Proximal sigmoid diverticulitis is noted. Vascular/Lymphatic: No significant vascular findings are present. No enlarged abdominal or pelvic lymph nodes. Reproductive: Prostate is unremarkable. Other: No abdominal wall hernia or abnormality. No abdominopelvic ascites. Musculoskeletal: No acute or significant osseous findings. IMPRESSION: Acute sigmoid diverticulitis.  No definite abscess is noted. Electronically Signed   By: Marijo Conception, M.D.   On: 07/17/2017 13:53    Procedures Procedures (including critical care time)  Medications Ordered in ED Medications  sodium chloride 0.9 % bolus 1,000 mL (0 mLs Intravenous Stopped 07/17/17 1414)  iopamidol (ISOVUE-300) 61 % injection (100 mLs  Contrast Given 07/17/17 1326)  metroNIDAZOLE (FLAGYL) tablet 500 mg (500 mg Oral Given 07/17/17 1414)  ciprofloxacin (CIPRO) tablet 500 mg (500 mg Oral Given 07/17/17 1414)     Initial Impression / Assessment and Plan / ED Course  I have reviewed the triage  vital signs and the nursing notes.  Pertinent labs & imaging results that were available during my care of the patient were reviewed by me and considered in my medical decision making (see chart for details).      Patient presents with abdominal pain, nausea, vomiting, and diarrhea.  Patient is nontoxic appearing, afebrile, not tachycardic, not tachypneic, not hypotensive, maintains SPO2 of 98-99% on room air, and is in no apparent distress. CT notes acute diverticulitis without abscess.  Doubt sepsis.  No signs of peritonitis.  Hemoccult negative.  We will initiate antibiotic therapy and recommend GI follow-up. Tolerating PO at discharge. The patient was given instructions for home care as well as  return precautions. Patient voices understanding of these instructions, accepts the plan, and is comfortable with discharge.    Vitals:   07/17/17 1031 07/17/17 1215 07/17/17 1315 07/17/17 1400  BP: (!) 140/95 (!) 119/91 127/88 120/60  Pulse: 93 87 67 69  Resp: (!) 22   16  Temp:      TempSrc:      SpO2: 98% 99% 98% 99%      Final Clinical Impressions(s) / ED Diagnoses   Final diagnoses:  Sigmoid diverticulitis    ED Discharge Orders        Ordered    ciprofloxacin (CIPRO) 500 MG tablet  2 times daily     07/17/17 1405    metroNIDAZOLE (FLAGYL) 500 MG tablet  2 times daily,   Status:  Discontinued     07/17/17 1405    ondansetron (ZOFRAN ODT) 4 MG disintegrating tablet  Every 8 hours PRN     07/17/17 1405    HYDROcodone-acetaminophen (NORCO/VICODIN) 5-325 MG tablet  Every 6 hours PRN     07/17/17 1405    dicyclomine (BENTYL) 20 MG tablet  2 times daily     07/17/17 1405    metroNIDAZOLE (FLAGYL) 500 MG tablet  3 times daily     07/17/17 1415       Lorayne Bender, PA-C 07/17/17 1429    Lorayne Bender, PA-C 07/17/17 1430    Valarie Merino, MD 07/18/17 2315

## 2017-07-17 NOTE — Discharge Instructions (Addendum)
There was evidence of diverticulitis on the CT scan.  Please refer to the attached information sheet.  Antiinflammatory medications: May take 600 mg of ibuprofen every 6 hours or 440 mg (over the counter dose) to 500 mg (prescription dose) of naproxen every 12 hours for the next 3 days. After this time, these medications may be used as needed for pain. Take these medications with food to avoid upset stomach. Choose only one of these medications, do not take them together. Tylenol: Should you continue to have additional pain while taking the ibuprofen or naproxen, you may add in tylenol as needed. Your daily total maximum amount of tylenol from all sources should be limited to 4000mg /day for persons without liver problems, or 2000mg /day for those with liver problems.  Vicodin: May take Vicodin as needed for severe pain.  Do not drive or perform other dangerous activities while taking the Vicodin.  Please note that each pill of Vicodin contains 325 mg of Tylenol and the above dosage limits apply.  Bentyl: May take this medication as needed for abdominal cramping.  Zofran: Use Zofran as needed for nausea/vomiting.  Please take all of your antibiotics until finished!   You may develop abdominal discomfort or diarrhea from the antibiotic.  You may help offset this with probiotics which you can buy or get in yogurt. Do not eat or take the probiotics until 2 hours after your antibiotic.   Follow-up: Please follow-up with the gastroenterologist as soon as possible. Call the number provided to set up an appointment.  Return: Return to the ED for worsening symptoms.

## 2017-07-17 NOTE — ED Notes (Signed)
EDP at bedside  

## 2017-07-17 NOTE — ED Triage Notes (Signed)
Patient complains of lower abdominal pain with vomiting and diarrhea after eating food over the weekend that he thinks was bad. No vomiting this am, reports that his stool is dark the past 2 days

## 2018-11-20 ENCOUNTER — Emergency Department (HOSPITAL_COMMUNITY)
Admission: EM | Admit: 2018-11-20 | Discharge: 2018-11-20 | Disposition: A | Discharge status: Home / Self Care | Payer: Self-pay | Attending: Emergency Medicine | Admitting: Emergency Medicine

## 2018-11-20 ENCOUNTER — Emergency Department (HOSPITAL_COMMUNITY): Payer: Self-pay

## 2018-11-20 ENCOUNTER — Other Ambulatory Visit: Payer: Self-pay

## 2018-11-20 DIAGNOSIS — Z79899 Other long term (current) drug therapy: Secondary | ICD-10-CM | POA: Insufficient documentation

## 2018-11-20 DIAGNOSIS — Y929 Unspecified place or not applicable: Secondary | ICD-10-CM | POA: Insufficient documentation

## 2018-11-20 DIAGNOSIS — S81012A Laceration without foreign body, left knee, initial encounter: Secondary | ICD-10-CM | POA: Insufficient documentation

## 2018-11-20 DIAGNOSIS — W293XXA Contact with powered garden and outdoor hand tools and machinery, initial encounter: Secondary | ICD-10-CM | POA: Insufficient documentation

## 2018-11-20 DIAGNOSIS — Y999 Unspecified external cause status: Secondary | ICD-10-CM | POA: Insufficient documentation

## 2018-11-20 DIAGNOSIS — Y93H2 Activity, gardening and landscaping: Secondary | ICD-10-CM | POA: Insufficient documentation

## 2018-11-20 MED ORDER — LIDOCAINE HCL (PF) 1 % IJ SOLN
10.0000 mL | Freq: Once | INTRAMUSCULAR | Status: AC
  Administered 2018-11-20: 10 mL
  Filled 2018-11-20: qty 10

## 2018-11-20 NOTE — ED Triage Notes (Signed)
Pt here after for knee lac from chainsaw. Pt arrives ambulatory, with blood soaked wrap on L knee, sts it is approximately 3 inches long. Denies pain.

## 2018-11-20 NOTE — ED Provider Notes (Signed)
[facility] EMERGENCY DEPARTMENT Provider Note   CSN: [id number] Arrival date & time: 11/20/18  1425    History   Chief Complaint No chief complaint on file.   HPI Kevin Mckinney is a 63 y.o. male.     63 year old gentleman with no past medical history presents emergency department for laceration to the left knee.  Patient reports that he was outside today and he was trying to cut some branches with a chainsaw in his hand slipped and he cut through his pants into his left knee with a chainsaw.  Reports that he did not think he even cut himself at first but felt a sting and looked down and saw blood. No other injuries. Ambulatory, not on anticoagulations     No past medical history on file.  There are no active problems to display for this patient.   No past surgical history on file.      Home Medications    Prior to Admission medications   Medication Sig Start Date End Date Taking? Authorizing Provider  acyclovir (ZOVIRAX) 800 MG tablet Take 1 tablet (800 mg total) by mouth 5 (five) times daily. 03/12/15   Mayer Camel, MD  ciprofloxacin (CIPRO) 500 MG tablet Take 1 tablet (500 mg total) by mouth 2 (two) times daily. 07/17/17   Joy, Shawn C, PA-C  cyclobenzaprine (FLEXERIL) 10 MG tablet Take 1 tablet (10 mg total) by mouth 2 (two) times daily as needed for muscle spasms. 03/12/15   Mayer Camel, MD  dicyclomine (BENTYL) 20 MG tablet Take 1 tablet (20 mg total) by mouth 2 (two) times daily. 07/17/17   Joy, Shawn C, PA-C  HYDROcodone-acetaminophen (NORCO/VICODIN) 5-325 MG tablet Take 1-2 tablets by mouth every 6 (six) hours as needed for severe pain. 07/17/17   Joy, Shawn C, PA-C  ondansetron (ZOFRAN ODT) 4 MG disintegrating tablet Take 1 tablet (4 mg total) by mouth every 8 (eight) hours as needed for nausea or vomiting. 07/17/17   Joy, Helane Gunther, PA-C    Family History No family history on file.  Social History Social History   Tobacco Use  .  Smoking status: Never Smoker  . Smokeless tobacco: Never Used  Substance Use Topics  . Alcohol use: No  . Drug use: No     Allergies   Patient has no known allergies.   Review of Systems Review of Systems  Musculoskeletal: Negative for arthralgias and back pain.  Skin: Positive for wound.  Neurological: Negative for dizziness and light-headedness.  Hematological: Does not bruise/bleed easily.     Physical Exam Updated Vital Signs BP (!) 152/93   Pulse 93   Temp 98.5 F (36.9 C) (Oral)   Resp 16   Ht 6' (1.829 m)   Wt 99.8 kg   SpO2 98%   BMI 29.84 kg/m   Physical Exam Vitals signs and nursing note reviewed. Exam conducted with a chaperone present.  Constitutional:      General: He is not in acute distress.    Appearance: Normal appearance. He is not ill-appearing, toxic-appearing or diaphoretic.  HENT:     Head: Normocephalic and atraumatic.  Eyes:     Conjunctiva/sclera: Conjunctivae normal.  Pulmonary:     Effort: Pulmonary effort is normal.  Musculoskeletal:     Left knee: He exhibits laceration. He exhibits normal range of motion, no swelling, no effusion, no ecchymosis, no deformity, no erythema and normal alignment. No tenderness found.       Legs:  Skin:    General: Skin is dry.  Neurological:     Mental Status: He is alert.  Psychiatric:        Mood and Affect: Mood normal.      ED Treatments / Results  Labs (all labs ordered are listed, but only abnormal results are displayed) Labs Reviewed - No data to display  EKG None  Radiology Dg Knee 2 Views Left  Result Date: 11/20/2018 CLINICAL DATA:  Chainsaw injury.  Knee laceration. EXAM: LEFT KNEE - 1-2 VIEW COMPARISON:  07/23/2012. FINDINGS: The mineralization and alignment are normal. There is no evidence of acute fracture or dislocation. Minimal joint space narrowing in the medial compartment. There is soft tissue irregularity superior to the patella on the lateral view consistent with a  laceration. Faint overlying linear densities in this area appear external to the patient, attributed to bandages. No definite foreign body, joint effusion or intra-articular air. IMPRESSION: No evidence of foreign body or acute osseous findings. Soft tissue laceration superior to the patella; cannot exclude quadriceps tendon injury. Electronically Signed   By: Richardean Sale M.D.   On: 11/20/2018 15:25    Procedures .Marland KitchenLaceration Repair Date/Time: 11/20/2018 3:52 PM Performed by: Alveria Apley, PA-C Authorized by: Alveria Apley, PA-C   Consent:    Consent obtained:  Verbal   Consent given by:  Patient   Risks discussed:  Infection, need for additional repair, pain, poor cosmetic result and poor wound healing   Alternatives discussed:  No treatment and delayed treatment Universal protocol:    Procedure explained and questions answered to patient or proxy's satisfaction: yes     Relevant documents present and verified: yes     Test results available and properly labeled: yes     Imaging studies available: yes     Required blood products, implants, devices, and special equipment available: yes     Site/side marked: yes     Immediately prior to procedure, a time out was called: yes     Patient identity confirmed:  Verbally with patient Anesthesia (see MAR for exact dosages):    Anesthesia method:  Local infiltration Laceration details:    Location:  Leg   Leg location:  L knee   Length (cm):  3.5   Depth (mm):  4 Repair type:    Repair type:  Simple Pre-procedure details:    Preparation:  Patient was prepped and draped in usual sterile fashion Exploration:    Wound extent: no tendon damage noted, no underlying fracture noted and no vascular damage noted     Contaminated: no   Treatment:    Area cleansed with:  Betadine, soap and water and saline   Amount of cleaning:  Standard   Irrigation solution:  Sterile saline Skin repair:    Repair method:  Sutures   Suture size:  3-0    Suture material:  Nylon   Suture technique:  Simple interrupted   Number of sutures:  7 Approximation:    Approximation:  Close Post-procedure details:    Dressing:  Non-adherent dressing   Patient tolerance of procedure:  Tolerated well, no immediate complications   (including critical care time)  Medications Ordered in ED Medications  lidocaine (PF) (XYLOCAINE) 1 % injection 10 mL (10 mLs Infiltration Given by Other 11/20/18 1454)     Initial Impression / Assessment and Plan / ED Course  I have reviewed the triage vital signs and the nursing notes.  Pertinent labs & imaging results that were  available during my care of the patient were reviewed by me and considered in my medical decision making (see chart for details).  Clinical Course as of Nov 20 1551  Tue Nov 20, 2018  1551 Patient repaired with sutures per his request (he did not want staples.) Xray showing no bony abnormality. Xray noted "cannot r/o quad tendon injury". However, I think this is highly unlikely since the patient has no pain and is walking normal and has normal musculoskeletal exam.   [KM]    Clinical Course User Index [KM] Alveria Apley, PA-C       Based on review of vitals, medical screening exam, lab work and/or imaging, there does not appear to be an acute, emergent etiology for the patient's symptoms. Counseled pt on good return precautions and encouraged both PCP and ED follow-up as needed.  Prior to discharge, I also discussed incidental imaging findings with patient in detail and advised appropriate, recommended follow-up in detail.  Clinical Impression: 1. Laceration of left knee, initial encounter     Disposition: Discharge  Prior to providing a prescription for a controlled substance, I independently reviewed the patient's recent prescription history on the [facility]. The patient had no recent or regular prescriptions and was deemed appropriate for  a brief, less than 3 day prescription of narcotic for acute analgesia.  This note was prepared with assistance of Systems analyst. Occasional wrong-word or sound-a-like substitutions may have occurred due to the inherent limitations of voice recognition software.   Final Clinical Impressions(s) / ED Diagnoses   Final diagnoses:  Laceration of left knee, initial encounter    ED Discharge Orders    None       Kristine Royal 11/20/18 1553    Davonna Belling, MD 11/21/18 [phone]

## 2018-11-20 NOTE — ED Notes (Signed)
Patient transported to X-ray 

## 2018-11-20 NOTE — Discharge Instructions (Addendum)
Thank you for allowing me to care for you today. Please return to the emergency department if you have new or worsening symptoms. Take your medications as instructed.  ° °

## 2018-11-20 NOTE — ED Notes (Signed)
Wound re-wrapped, instructions and supplies for home given.

## 2018-11-20 NOTE — ED Notes (Signed)
Patient verbalizes understanding of discharge instructions. Opportunity for questioning and answers were provided. Armband removed by staff, pt discharged from ED. Pt wheeled to lobby for comfort.

## 2020-01-27 ENCOUNTER — Emergency Department (HOSPITAL_COMMUNITY)
Admission: EM | Admit: 2020-01-27 | Discharge: 2020-01-27 | Disposition: A | Discharge status: Home / Self Care | Payer: Self-pay | Attending: Emergency Medicine | Admitting: Emergency Medicine

## 2020-01-27 ENCOUNTER — Encounter (HOSPITAL_COMMUNITY): Payer: Self-pay | Admitting: *Deleted

## 2020-01-27 ENCOUNTER — Emergency Department (HOSPITAL_COMMUNITY): Payer: Self-pay

## 2020-01-27 ENCOUNTER — Other Ambulatory Visit: Payer: Self-pay

## 2020-01-27 DIAGNOSIS — M546 Pain in thoracic spine: Secondary | ICD-10-CM | POA: Insufficient documentation

## 2020-01-27 DIAGNOSIS — M549 Dorsalgia, unspecified: Secondary | ICD-10-CM

## 2020-01-27 HISTORY — DX: Unspecified hemorrhoids: K64.9

## 2020-01-27 MED ORDER — CYCLOBENZAPRINE HCL 10 MG PO TABS
5.0000 mg | ORAL_TABLET | Freq: Once | ORAL | Status: AC
  Administered 2020-01-27: 5 mg via ORAL
  Filled 2020-01-27: qty 1

## 2020-01-27 MED ORDER — CYCLOBENZAPRINE HCL 10 MG PO TABS: 10.0000 mg | ORAL_TABLET | Freq: Two times a day (BID) | ORAL | 0 refills | Status: AC | PRN

## 2020-01-27 MED ORDER — NAPROXEN 250 MG PO TABS
500.0000 mg | ORAL_TABLET | Freq: Once | ORAL | Status: AC
  Administered 2020-01-27: 500 mg via ORAL
  Filled 2020-01-27: qty 2

## 2020-01-27 MED ORDER — NAPROXEN 500 MG PO TABS: 500.0000 mg | ORAL_TABLET | Freq: Two times a day (BID) | ORAL | 0 refills | Status: DC

## 2020-01-27 NOTE — ED Notes (Signed)
Pt transported to XRAY °

## 2020-01-27 NOTE — ED Provider Notes (Signed)
[facility] EMERGENCY DEPARTMENT Provider Note   CSN: [id number] Arrival date & time: 01/27/20  1629     History Chief Complaint  Patient presents with   Back Pain   Elbow Pain    Kevin Mckinney is a 64 y.o. male.  Patient presents with mid back pain for two months. Patient reports he lifted something heavy and felt a pain in his mid back. It has been off and on now. Worse at night when trying to roll over. Denies fever, chills, weight loss, n/v/d, saddle anesthesia, loss of control of bowel or bladder. Has not tried anything for relief. He reports he was concered because he knew someone who had back pain and was then diagnosed with cancer.         Past Medical History:  Diagnosis Date   Hemorrhoids     There are no problems to display for this patient.   History reviewed. No pertinent surgical history.     No family history on file.  Social History   Tobacco Use   Smoking status: Never Smoker   Smokeless tobacco: Never Used  Substance Use Topics   Alcohol use: No   Drug use: No    Home Medications Prior to Admission medications   Medication Sig Start Date End Date Taking? Authorizing Provider  acyclovir (ZOVIRAX) 800 MG tablet Take 1 tablet (800 mg total) by mouth 5 (five) times daily. 03/12/15   Mayer Camel, MD  ciprofloxacin (CIPRO) 500 MG tablet Take 1 tablet (500 mg total) by mouth 2 (two) times daily. 07/17/17   Joy, Shawn C, PA-C  cyclobenzaprine (FLEXERIL) 10 MG tablet Take 1 tablet (10 mg total) by mouth 2 (two) times daily as needed for up to 7 days for muscle spasms. 01/27/20 02/03/20  Alveria Apley, PA-C  dicyclomine (BENTYL) 20 MG tablet Take 1 tablet (20 mg total) by mouth 2 (two) times daily. 07/17/17   Joy, Shawn C, PA-C  HYDROcodone-acetaminophen (NORCO/VICODIN) 5-325 MG tablet Take 1-2 tablets by mouth every 6 (six) hours as needed for severe pain. 07/17/17   Joy, Shawn C, PA-C  naproxen (NAPROSYN) 500 MG tablet Take 1  tablet (500 mg total) by mouth 2 (two) times daily. 01/27/20   Madilyn Hook A, PA-C  ondansetron (ZOFRAN ODT) 4 MG disintegrating tablet Take 1 tablet (4 mg total) by mouth every 8 (eight) hours as needed for nausea or vomiting. 07/17/17   Joy, Shawn C, PA-C    Allergies    Patient has no known allergies.  Review of Systems   Review of Systems  Constitutional: Negative for appetite change, chills, diaphoresis, fatigue and fever.  HENT: Negative for congestion, sinus pain and sore throat.   Respiratory: Negative for cough and shortness of breath.   Cardiovascular: Negative for chest pain.  Gastrointestinal: Negative for diarrhea, nausea and vomiting.  Genitourinary: Negative for difficulty urinating, flank pain, hematuria, scrotal swelling, testicular pain and urgency.  Musculoskeletal: Positive for arthralgias and back pain. Negative for gait problem, joint swelling, myalgias, neck pain and neck stiffness.  Skin: Negative.  Negative for rash.  Neurological: Negative for dizziness, syncope, light-headedness, numbness and headaches.    Physical Exam Updated Vital Signs BP (!) 141/97 (BP Location: Right Arm)    Pulse 70    Temp 98.5 F (36.9 C) (Oral)    Resp 16    SpO2 99%   Physical Exam  ED Results / Procedures / Treatments   Labs (all labs ordered are listed,  but only abnormal results are displayed) Labs Reviewed - No data to display  EKG None  Radiology DG Thoracic Spine 2 View  Result Date: 01/27/2020 CLINICAL DATA:  Back pain EXAM: THORACIC SPINE 2 VIEWS COMPARISON:  03/12/2015 FINDINGS: Two view radiograph of the thoracic spine demonstrates normal thoracic kyphosis. No fracture or listhesis of the thoracic spine. There is mild endplate remodeling within the midthoracic spine, slightly progressive since prior examination, in keeping with changes of mild degenerative disc disease. The paraspinal soft tissues are unremarkable. 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 Electronically Signed   By: Fidela Salisbury MD   On: 01/27/2020 20:58    Procedures Procedures (including critical care time)  Medications Ordered in ED Medications  naproxen (NAPROSYN) tablet 500 mg (500 mg Oral Given 01/27/20 2044)  cyclobenzaprine (FLEXERIL) tablet 5 mg (5 mg Oral Given 01/27/20 2044)    ED Course  I have reviewed the triage vital signs and the nursing notes.  Pertinent labs & imaging results that were available during my care of the patient were reviewed by me and considered in my medical decision making (see chart for details).  Clinical Course as of Jan 26 2106  Mon Jan 27, 2020  2104 Patient was evaluated for back pain today. Patient has no concerning symptoms or physical exam findings including no fever, no loss of control of bowel or bladder, no urinary retention, no saddle anesthesia, no leg weakness and no pain radiation into the legs. Pain started after injury 2 months ago. He was given medication to treat his symptoms and advised to f/u with PMD for further workup including possible PT, medication change, further imaging, etc. He was advised on all concerning symptoms above and to return to the ED if any of them arise.       [KM]    Clinical Course User Index [KM] Kristine Royal   MDM Rules/Calculators/A&P                          Based on review of vitals, medical screening exam, lab work and/or imaging, there does not appear to be an acute, emergent etiology for the patient's symptoms. Counseled pt on good return precautions and encouraged both PCP and ED follow-up as needed.  Prior to discharge, I also discussed incidental imaging findings with patient in detail and advised appropriate, recommended follow-up in detail.  Clinical Impression: 1. Mid back pain     Disposition: Discharge  Prior to providing a prescription for a controlled substance, I independently reviewed the patient's recent prescription  history on the [place]. The patient had no recent or regular prescriptions and was deemed appropriate for a brief, less than 3 day prescription of narcotic for acute analgesia.  This note was prepared with assistance of Systems analyst. Occasional wrong-word or sound-a-like substitutions may have occurred due to the inherent limitations of voice recognition software.  Final Clinical Impression(s) / ED Diagnoses Final diagnoses:  Mid back pain    Rx / DC Orders ED Discharge Orders         Ordered    cyclobenzaprine (FLEXERIL) 10 MG tablet  2 times daily PRN     Discontinue  Reprint     01/27/20 2107    naproxen (NAPROSYN) 500 MG tablet  2 times daily     Discontinue  Reprint     01/27/20 2107  Kristine Royal 01/27/20 2107    Charlesetta Shanks, MD 01/27/20 [phone]

## 2020-01-27 NOTE — ED Notes (Signed)
Pt ambulated to room, noted to have a steady gait.

## 2020-01-27 NOTE — Discharge Instructions (Addendum)
You were seen today for back pain. You have arthritis in your back but no other concerning findings. It is very important that you get established with a primary care provider. We will start you on medications and give you instructions on maintaining a healthy back. Thank you for allowing me to care for you today. Please return to the emergency department if you have new or worsening symptoms. Take your medications as instructed.

## 2020-01-27 NOTE — ED Notes (Signed)
Discharge instructions reviewed with pt. Pt verbalized understanding.   

## 2020-01-27 NOTE — ED Triage Notes (Signed)
Pt is here with left elbow pain for 3 months and hurts to move it.  Complains of center of the back pain and feels it when he goes to bed and tries turning over.  Pt remembers he felt his back hurt after picking up something for a couple of months.

## 2021-01-08 ENCOUNTER — Encounter (HOSPITAL_COMMUNITY): Payer: Self-pay | Admitting: Emergency Medicine

## 2021-01-08 ENCOUNTER — Emergency Department (HOSPITAL_COMMUNITY): Payer: Medicare Other

## 2021-01-08 ENCOUNTER — Inpatient Hospital Stay (HOSPITAL_COMMUNITY)
Admission: EM | Admit: 2021-01-08 | Discharge: 2021-01-11 | DRG: 392 | Disposition: A | Discharge status: Home / Self Care | Payer: Medicare Other | Attending: General Surgery | Admitting: General Surgery

## 2021-01-08 DIAGNOSIS — E739 Lactose intolerance, unspecified: Secondary | ICD-10-CM | POA: Diagnosis present

## 2021-01-08 DIAGNOSIS — Z20822 Contact with and (suspected) exposure to covid-19: Secondary | ICD-10-CM | POA: Diagnosis present

## 2021-01-08 DIAGNOSIS — K5792 Diverticulitis of intestine, part unspecified, without perforation or abscess without bleeding: Secondary | ICD-10-CM | POA: Diagnosis present

## 2021-01-08 DIAGNOSIS — R195 Other fecal abnormalities: Secondary | ICD-10-CM | POA: Diagnosis present

## 2021-01-08 DIAGNOSIS — K5720 Diverticulitis of large intestine with perforation and abscess without bleeding: Secondary | ICD-10-CM | POA: Diagnosis not present

## 2021-01-08 DIAGNOSIS — R188 Other ascites: Secondary | ICD-10-CM | POA: Diagnosis present

## 2021-01-08 DIAGNOSIS — K649 Unspecified hemorrhoids: Secondary | ICD-10-CM | POA: Diagnosis present

## 2021-01-08 DIAGNOSIS — K5780 Diverticulitis of intestine, part unspecified, with perforation and abscess without bleeding: Secondary | ICD-10-CM | POA: Diagnosis not present

## 2021-01-08 DIAGNOSIS — Z79899 Other long term (current) drug therapy: Secondary | ICD-10-CM

## 2021-01-08 DIAGNOSIS — K5732 Diverticulitis of large intestine without perforation or abscess without bleeding: Secondary | ICD-10-CM

## 2021-01-08 LAB — URINALYSIS, ROUTINE W REFLEX MICROSCOPIC
Bacteria, UA: NONE SEEN
Bilirubin Urine: NEGATIVE
Glucose, UA: NEGATIVE mg/dL
Ketones, ur: NEGATIVE mg/dL
Leukocytes,Ua: NEGATIVE
Nitrite: NEGATIVE
Protein, ur: NEGATIVE mg/dL
Specific Gravity, Urine: 1.019 (ref 1.005–1.030)
pH: 5 (ref 5.0–8.0)

## 2021-01-08 LAB — COMPREHENSIVE METABOLIC PANEL
ALT: 18 U/L (ref 0–44)
AST: 20 U/L (ref 15–41)
Albumin: 3.4 g/dL — ABNORMAL LOW (ref 3.5–5.0)
Alkaline Phosphatase: 60 U/L (ref 38–126)
Anion gap: 6 (ref 5–15)
BUN: 12 mg/dL (ref 8–23)
CO2: 26 mmol/L (ref 22–32)
Calcium: 8.6 mg/dL — ABNORMAL LOW (ref 8.9–10.3)
Chloride: 107 mmol/L (ref 98–111)
Creatinine, Ser: 1.22 mg/dL (ref 0.61–1.24)
GFR, Estimated: >60 mL/min (ref >60)
Glucose, Bld: 107 mg/dL — ABNORMAL HIGH (ref 70–99)
Potassium: 4 mmol/L (ref 3.5–5.1)
Sodium: 139 mmol/L (ref 135–145)
Total Bilirubin: 1.2 mg/dL (ref 0.3–1.2)
Total Protein: 6.6 g/dL (ref 6.5–8.1)

## 2021-01-08 LAB — HIV ANTIBODY (ROUTINE TESTING W REFLEX): HIV Screen 4th Generation wRfx: NONREACTIVE

## 2021-01-08 LAB — RESP PANEL BY RT-PCR (FLU A&B, COVID) ARPGX2
Influenza A by PCR: NEGATIVE
Influenza B by PCR: NEGATIVE
SARS Coronavirus 2 by RT PCR: NEGATIVE

## 2021-01-08 LAB — CBC
HCT: 38.8 % — ABNORMAL LOW (ref 39.0–52.0)
Hemoglobin: 12.7 g/dL — ABNORMAL LOW (ref 13.0–17.0)
MCH: 29.7 pg (ref 26.0–34.0)
MCHC: 32.7 g/dL (ref 30.0–36.0)
MCV: 90.7 fL (ref 80.0–100.0)
Platelets: 197 10*3/uL (ref 150–400)
RBC: 4.28 MIL/uL (ref 4.22–5.81)
RDW: 12.5 % (ref 11.5–15.5)
WBC: 8.3 10*3/uL (ref 4.0–10.5)
nRBC: 0 % (ref 0.0–0.2)

## 2021-01-08 LAB — LIPASE, BLOOD: Lipase: 60 U/L — ABNORMAL HIGH (ref 11–51)

## 2021-01-08 MED ORDER — ONDANSETRON 4 MG PO TBDP
4.0000 mg | ORAL_TABLET | Freq: Four times a day (QID) | ORAL | Status: DC | PRN
  Administered 2021-01-10: 4 mg via ORAL
  Filled 2021-01-08: qty 1

## 2021-01-08 MED ORDER — SODIUM CHLORIDE 0.9 % IV SOLN
INTRAVENOUS | Status: DC
Start: 2021-01-08 — End: 2021-01-10

## 2021-01-08 MED ORDER — METRONIDAZOLE 500 MG/100ML IV SOLN
500.0000 mg | Freq: Once | INTRAVENOUS | Status: AC
  Administered 2021-01-08: 500 mg via INTRAVENOUS
  Filled 2021-01-08: qty 100

## 2021-01-08 MED ORDER — ONDANSETRON HCL 4 MG/2ML IJ SOLN
4.0000 mg | Freq: Once | INTRAMUSCULAR | Status: AC
  Administered 2021-01-08: 4 mg via INTRAVENOUS
  Filled 2021-01-08: qty 2

## 2021-01-08 MED ORDER — SODIUM CHLORIDE 0.9 % IV SOLN
2.0000 g | INTRAVENOUS | Status: DC
  Administered 2021-01-09: 2 g via INTRAVENOUS
  Filled 2021-01-08: qty 20
  Filled 2021-01-08: qty 2

## 2021-01-08 MED ORDER — ENOXAPARIN SODIUM 40 MG/0.4ML IJ SOSY
40.0000 mg | PREFILLED_SYRINGE | INTRAMUSCULAR | Status: DC
  Administered 2021-01-09 – 2021-01-10 (×2): 40 mg via SUBCUTANEOUS
  Filled 2021-01-08 (×2): qty 0.4

## 2021-01-08 MED ORDER — MORPHINE SULFATE (PF) 2 MG/ML IV SOLN: 1.0000 mg | INTRAVENOUS | Status: DC | PRN

## 2021-01-08 MED ORDER — KETOROLAC TROMETHAMINE 30 MG/ML IJ SOLN
15.0000 mg | Freq: Once | INTRAMUSCULAR | Status: AC
  Administered 2021-01-08: 15 mg via INTRAVENOUS
  Filled 2021-01-08: qty 1

## 2021-01-08 MED ORDER — SODIUM CHLORIDE 0.9 % IV SOLN
2.0000 g | Freq: Once | INTRAVENOUS | Status: AC
  Administered 2021-01-08: 2 g via INTRAVENOUS
  Filled 2021-01-08: qty 20

## 2021-01-08 MED ORDER — DIPHENHYDRAMINE HCL 50 MG/ML IJ SOLN: 12.5000 mg | Freq: Four times a day (QID) | INTRAMUSCULAR | Status: DC | PRN

## 2021-01-08 MED ORDER — ACETAMINOPHEN 500 MG PO TABS
1000.0000 mg | ORAL_TABLET | Freq: Four times a day (QID) | ORAL | Status: DC | PRN
  Administered 2021-01-11: 1000 mg via ORAL
  Filled 2021-01-08: qty 2

## 2021-01-08 MED ORDER — IOHEXOL 350 MG/ML SOLN
100.0000 mL | Freq: Once | INTRAVENOUS | Status: AC | PRN
  Administered 2021-01-08: 100 mL via INTRAVENOUS

## 2021-01-08 MED ORDER — SODIUM CHLORIDE 0.9 % IV BOLUS
500.0000 mL | Freq: Once | INTRAVENOUS | Status: AC
  Administered 2021-01-08: 500 mL via INTRAVENOUS

## 2021-01-08 MED ORDER — SIMETHICONE 80 MG PO CHEW: 40.0000 mg | CHEWABLE_TABLET | Freq: Four times a day (QID) | ORAL | Status: DC | PRN

## 2021-01-08 MED ORDER — ONDANSETRON HCL 4 MG/2ML IJ SOLN
4.0000 mg | Freq: Four times a day (QID) | INTRAMUSCULAR | Status: DC | PRN
  Administered 2021-01-11: 4 mg via INTRAVENOUS
  Filled 2021-01-08: qty 2

## 2021-01-08 MED ORDER — METRONIDAZOLE 500 MG/100ML IV SOLN
500.0000 mg | Freq: Three times a day (TID) | INTRAVENOUS | Status: DC
  Administered 2021-01-08 – 2021-01-10 (×5): 500 mg via INTRAVENOUS
  Filled 2021-01-08 (×5): qty 100

## 2021-01-08 MED ORDER — HYDRALAZINE HCL 20 MG/ML IJ SOLN: 10.0000 mg | INTRAMUSCULAR | Status: DC | PRN

## 2021-01-08 MED ORDER — MELATONIN 3 MG PO TABS
3.0000 mg | ORAL_TABLET | Freq: Every evening | ORAL | Status: DC | PRN
  Administered 2021-01-08: 3 mg via ORAL
  Filled 2021-01-08: qty 1

## 2021-01-08 MED ORDER — DIPHENHYDRAMINE HCL 12.5 MG/5ML PO ELIX
12.5000 mg | ORAL_SOLUTION | Freq: Four times a day (QID) | ORAL | Status: DC | PRN
  Administered 2021-01-08: 12.5 mg via ORAL
  Filled 2021-01-08: qty 10

## 2021-01-08 MED ORDER — TRAMADOL HCL 50 MG PO TABS
50.0000 mg | ORAL_TABLET | Freq: Four times a day (QID) | ORAL | Status: DC | PRN
  Administered 2021-01-08 – 2021-01-09 (×2): 50 mg via ORAL
  Filled 2021-01-08 (×2): qty 1

## 2021-01-08 NOTE — ED Notes (Signed)
Admitting at BS

## 2021-01-08 NOTE — Progress Notes (Signed)
Patient arrvied to room 6N3 via stretcher from ED. Patient is alert and oriented times 4, NAD. Patient is ambulatory in the room. Belongings and call bell within reach. Bed is in the lowest and locked position with bed rails up times 3. Patient oriented to room and call bell system.

## 2021-01-08 NOTE — ED Triage Notes (Signed)
Pt endorses lower abd and groin pain since Monday. Pain when walking/moving and now having black stool this morning. Last BM today. Denies any groin swelling.

## 2021-01-08 NOTE — ED Provider Notes (Signed)
[facility] EMERGENCY DEPARTMENT Provider Note   CSN: [id number] Arrival date & time: 01/08/21  0815     History Chief Complaint  Patient presents with   Abdominal Pain   Groin Pain    Kevin Mckinney is a 65 y.o. male.  65 year old male with prior medical history as detailed below presents for evaluation.  Patient complains of crampy diffuse abdominal pain.  This pain began on Tuesday of this week.  He denies any vomiting.  He reports a mild intermittent nausea.  He denies fever.  He reports that his stool is darker in color than normal.  He denies diarrhea.  He denies bloody emesis or blood in stool.  The history is provided by the patient and medical records.  Abdominal Pain Pain location:  Generalized Pain quality: aching and cramping   Pain radiates to:  Does not radiate Pain severity:  Mild Onset quality:  Gradual Duration:  4 days Timing:  Constant Progression:  Waxing and waning Chronicity:  New Groin Pain Associated symptoms include abdominal pain.      Past Medical History:  Diagnosis Date   Hemorrhoids     There are no problems to display for this patient.   History reviewed. No pertinent surgical history.     No family history on file.  Social History   Tobacco Use   Smoking status: Never   Smokeless tobacco: Never  Substance Use Topics   Alcohol use: No   Drug use: No    Home Medications Prior to Admission medications   Medication Sig Start Date End Date Taking? Authorizing Provider  acyclovir (ZOVIRAX) 800 MG tablet Take 1 tablet (800 mg total) by mouth 5 (five) times daily. 03/12/15   Mayer Camel, MD  ciprofloxacin (CIPRO) 500 MG tablet Take 1 tablet (500 mg total) by mouth 2 (two) times daily. 07/17/17   Joy, Shawn C, PA-C  dicyclomine (BENTYL) 20 MG tablet Take 1 tablet (20 mg total) by mouth 2 (two) times daily. 07/17/17   Joy, Shawn C, PA-C  HYDROcodone-acetaminophen (NORCO/VICODIN) 5-325 MG tablet Take 1-2  tablets by mouth every 6 (six) hours as needed for severe pain. 07/17/17   Joy, Shawn C, PA-C  naproxen (NAPROSYN) 500 MG tablet Take 1 tablet (500 mg total) by mouth 2 (two) times daily. 01/27/20   Madilyn Hook A, PA-C  ondansetron (ZOFRAN ODT) 4 MG disintegrating tablet Take 1 tablet (4 mg total) by mouth every 8 (eight) hours as needed for nausea or vomiting. 07/17/17   Joy, Shawn C, PA-C    Allergies    Patient has no known allergies.  Review of Systems   Review of Systems  Gastrointestinal:  Positive for abdominal pain.  All other systems reviewed and are negative.  Physical Exam Updated Vital Signs BP (!) 145/92   Pulse 85   Temp 99.1 F (37.3 C) (Oral)   Resp 16   Ht '5\' 9"'$  (1.753 m)   Wt 100.7 kg   SpO2 99%   BMI 32.78 kg/m   Physical Exam Vitals and nursing note reviewed.  Constitutional:      General: He is not in acute distress.    Appearance: Normal appearance. He is well-developed.  HENT:     Head: Normocephalic and atraumatic.  Eyes:     Conjunctiva/sclera: Conjunctivae normal.     Pupils: Pupils are equal, round, and reactive to light.  Cardiovascular:     Rate and Rhythm: Normal rate and regular rhythm.  Heart sounds: Normal heart sounds.  Pulmonary:     Effort: Pulmonary effort is normal. No respiratory distress.     Breath sounds: Normal breath sounds.  Abdominal:     General: There is no distension.     Palpations: Abdomen is soft.     Tenderness: There is no abdominal tenderness.  Musculoskeletal:        General: No deformity. Normal range of motion.     Cervical back: Normal range of motion and neck supple.  Skin:    General: Skin is warm and dry.  Neurological:     General: No focal deficit present.     Mental Status: He is alert and oriented to person, place, and time.    ED Results / Procedures / Treatments   Labs (all labs ordered are listed, but only abnormal results are displayed) Labs Reviewed  LIPASE, BLOOD - Abnormal; Notable  for the following components:      Result Value   Lipase 60 (*)    All other components within normal limits  COMPREHENSIVE METABOLIC PANEL - Abnormal; Notable for the following components:   Glucose, Bld 107 (*)    Calcium 8.6 (*)    Albumin 3.4 (*)    All other components within normal limits  CBC - Abnormal; Notable for the following components:   Hemoglobin 12.7 (*)    HCT 38.8 (*)    All other components within normal limits  URINALYSIS, ROUTINE W REFLEX MICROSCOPIC - Abnormal; Notable for the following components:   Hgb urine dipstick SMALL (*)    All other components within normal limits  RESP PANEL BY RT-PCR (FLU A&B, COVID) ARPGX2  POC OCCULT BLOOD, ED    EKG None  Radiology CT ABDOMEN PELVIS W CONTRAST  Result Date: 01/08/2021 CLINICAL DATA:  Lower abdominal and groin pain since Monday, pain with walking and moving, now having black stools this morning EXAM: CT ABDOMEN AND PELVIS WITH CONTRAST TECHNIQUE: Multidetector CT imaging of the abdomen and pelvis was performed using the standard protocol following bolus administration of intravenous contrast. Sagittal and coronal MPR images reconstructed from axial data set. CONTRAST:  151m OMNIPAQUE IOHEXOL 350 MG/ML SOLN IV. No oral contrast. COMPARISON:  07/17/2017 FINDINGS: Lower chest: Lung bases clear Hepatobiliary: Small hepatic cysts unchanged. Gallbladder and liver otherwise normal appearance Pancreas: Normal appearance Spleen: Normal appearance.  Small splenule anterior to spleen. Adrenals/Urinary Tract: Adrenal glands, kidneys, ureters, and bladder normal appearance Stomach/Bowel: Normal appendix. Diverticulosis of distal descending and sigmoid colon with focal wall thickening and inflammatory changes at the proximal to mid sigmoid colon consistent with acute diverticulitis. Small fluid collection adjacent to versus within the colonic wall measuring 13 x 8 mm, question tiny abscess. No definite extraluminal gas. Stomach and  remaining bowel loops normal appearance. Vascular/Lymphatic: Aorta normal caliber without aneurysm or dissection. Minimal atherosclerotic calcification aorta. No adenopathy. Vascular structures patent. Reproductive: Prostate gland and seminal vesicles unremarkable. Other: No free air or free fluid. Probable small RIGHT inguinal hernia containing fat. Musculoskeletal: Mild degenerative disc disease changes L5-S1. IMPRESSION: Acute sigmoid diverticulitis changes with a 13 x 8 mm diameter fluid collection adjacent to versus within the colonic wall, suspicious for tiny abscess. No definite extraluminal gas identified. Small hepatic cysts. Probable small RIGHT inguinal hernia containing fat. Aortic Atherosclerosis (ICD10-I70.0). Electronically Signed   By: MLavonia DanaM.D.   On: 01/08/2021 14:04    Procedures Procedures   Medications Ordered in ED Medications  sodium chloride 0.9 % bolus 500 mL (  has no administration in time range)  ondansetron (ZOFRAN) injection 4 mg (has no administration in time range)  ketorolac (TORADOL) 30 MG/ML injection 15 mg (has no administration in time range)    ED Course  I have reviewed the triage vital signs and the nursing notes.  Pertinent labs & imaging results that were available during my care of the patient were reviewed by me and considered in my medical decision making (see chart for details).    MDM Rules/Calculators/A&P                           MDM  MSE complete  FITZHUGH MANAS was evaluated in Emergency Department on 01/08/2021 for the symptoms described in the history of present illness. He was evaluated in the context of the global COVID-19 pandemic, which necessitated consideration that the patient might be at risk for infection with the SARS-CoV-2 virus that causes COVID-19. Institutional protocols and algorithms that pertain to the evaluation of patients at risk for COVID-19 are in a state of rapid change based on information released by  regulatory bodies including the CDC and federal and state organizations. These policies and algorithms were followed during the patient's care in the ED.  Patient is presenting with complaint of diffuse abdominal discomfort.  Work-up is suggestive of sigmoid diverticulitis with possible small bowel perforation.  Case discussed with surgery.  Surgery plans to admit.  Patient understands plan of care.   Final Clinical Impression(s) / ED Diagnoses Final diagnoses:  Diverticulitis    Rx / DC Orders ED Discharge Orders     None        Valarie Merino, MD 01/08/21 [phone]

## 2021-01-08 NOTE — H&P (Signed)
Kevin Mckinney 1955-11-22  [id number].    Chief Complaint/Reason for Consult: diverticulitis  HPI:  This is a 65 yo black male with a history of hemorrhoids who is also lactose intolerant and drank a Frosty from [facility] on Tuesday and developed abdominal pain the following morning on the way to work.  He thought this was secondary to his lactose intolerance.  His pain started out diffuse but has migrated to his LLQ.  He denies any fevers or chills, CP, SOB, dysuria.  He took pepto bismol and states his most recent stools have been black.  He does have a history of hemorrhoids and occasionally will have BRBPR, but not recently.  He has had several colonoscopies in the past with no malignant findings per his report.  His most recent was over 10 yrs ago.  These were not done by anyone around here.    He continued to have pain despite pepto-bismol, ibuprofen, and other conservative measures at home.  Due to persistent pain he presented to the ED today where he was found to have a normal WBC, no fever, but a CT scan concerning for diverticulitis with a small possible fluid collection about 13x68m, which may be in the colon wall.  We have been asked to see him for further evaluation and admission.  ROS: ROS: Please see HPI, otherwise all other systems have been reviewed and are negative.  History reviewed. No pertinent family history.  Past Medical History:  Diagnosis Date   Hemorrhoids     Past Surgical History:  Procedure Laterality Date   HAND SURGERY      Social History:  reports that he has never smoked. He has never used smokeless tobacco. He reports that he does not drink alcohol and does not use drugs.  Allergies: No Known Allergies  (Not in a hospital admission)    Physical Exam: Blood pressure (!) 148/96, pulse (!) 53, temperature 99.1 F (37.3 C), temperature source Oral, resp. rate 15, height '5\' 9"'$  (1.753 m), weight 100.7 kg, SpO2 100 %. General: pleasant, WD, WN  black male who is laying in bed in NAD HEENT: head is normocephalic, atraumatic.  Sclera are noninjected.  PERRL.  Ears and nose without any masses or lesions.  Mouth is pink and moist Heart: regular, rate, and rhythm, but somewhat bradycardic.  Normal s1,s2. No obvious murmurs, gallops, or rubs noted.  Palpable radial and pedal pulses bilaterally Lungs: CTAB, no wheezes, rhonchi, or rales noted.  Respiratory effort nonlabored Abd: soft, mildly tender in LLQ, but no guarding or rebounding, ND, +BS, no masses, hernias, or organomegaly MS: all 4 extremities are symmetrical with no cyanosis, clubbing, or edema. Skin: warm and dry with no masses, lesions, or rashes Neuro: Cranial nerves 2-12 grossly intact, sensation is normal throughout Psych: A&Ox3 with an appropriate affect.   Results for orders placed or performed during the hospital encounter of 01/08/21 (from the past 48 hour(s))  Lipase, blood     Status: Abnormal   Collection Time: 01/08/21  8:31 AM  Result Value Ref Range   Lipase 60 (H) 11 - 51 U/L    Comment: Performed at [facility] [address] [place] [address] Comprehensive metabolic panel     Status: Abnormal   Collection Time: 01/08/21  8:31 AM  Result Value Ref Range   Sodium 139 135 - 145 mmol/L   Potassium 4.0 3.5 - 5.1 mmol/L   Chloride 107 98 - 111 mmol/L  CO2 26 22 - 32 mmol/L   Glucose, Bld 107 (H) 70 - 99 mg/dL    Comment: Glucose reference range applies only to samples taken after fasting for at least 8 hours.   BUN 12 8 - 23 mg/dL   Creatinine, Ser 1.22 0.61 - 1.24 mg/dL   Calcium 8.6 (L) 8.9 - 10.3 mg/dL   Total Protein 6.6 6.5 - 8.1 g/dL   Albumin 3.4 (L) 3.5 - 5.0 g/dL   AST 20 15 - 41 U/L   ALT 18 0 - 44 U/L   Alkaline Phosphatase 60 38 - 126 U/L   Total Bilirubin 1.2 0.3 - 1.2 mg/dL   GFR, Estimated >60 >60 mL/min    Comment: (NOTE) Calculated using the CKD-EPI Creatinine Equation (2021)    Anion gap 6 5 - 15    Comment:  Performed at [facility], [place] [address], [place], [place] [address]  CBC     Status: Abnormal   Collection Time: 01/08/21  8:31 AM  Result Value Ref Range   WBC 8.3 4.0 - 10.5 K/uL   RBC 4.28 4.22 - 5.81 MIL/uL   Hemoglobin 12.7 (L) 13.0 - 17.0 g/dL   HCT 38.8 (L) 39.0 - 52.0 %   MCV 90.7 80.0 - 100.0 fL   MCH 29.7 26.0 - 34.0 pg   MCHC 32.7 30.0 - 36.0 g/dL   RDW 12.5 11.5 - 15.5 %   Platelets 197 150 - 400 K/uL   nRBC 0.0 0.0 - 0.2 %    Comment: Performed at [facility], [place] [address], [place], [place] [address]  Urinalysis, Routine w reflex microscopic Urine, Clean Catch     Status: Abnormal   Collection Time: 01/08/21 10:47 AM  Result Value Ref Range   Color, Urine YELLOW YELLOW   APPearance CLEAR CLEAR   Specific Gravity, Urine 1.019 1.005 - 1.030   pH 5.0 5.0 - 8.0   Glucose, UA NEGATIVE NEGATIVE mg/dL   Hgb urine dipstick SMALL (A) NEGATIVE   Bilirubin Urine NEGATIVE NEGATIVE   Ketones, ur NEGATIVE NEGATIVE mg/dL   Protein, ur NEGATIVE NEGATIVE mg/dL   Nitrite NEGATIVE NEGATIVE   Leukocytes,Ua NEGATIVE NEGATIVE   RBC / HPF 0-5 0 - 5 RBC/hpf   WBC, UA 0-5 0 - 5 WBC/hpf   Bacteria, UA NONE SEEN NONE SEEN   Squamous Epithelial / LPF 0-5 0 - 5   Mucus PRESENT     Comment: Performed at [facility], [place] [address] [place], [place] [address]   CT ABDOMEN PELVIS W CONTRAST  Result Date: 01/08/2021 CLINICAL DATA:  Lower abdominal and groin pain since Monday, pain with walking and moving, now having black stools this morning EXAM: CT ABDOMEN AND PELVIS WITH CONTRAST TECHNIQUE: Multidetector CT imaging of the abdomen and pelvis was performed using the standard protocol following bolus administration of intravenous contrast. Sagittal and coronal MPR images reconstructed from axial data set. CONTRAST:  133m OMNIPAQUE IOHEXOL 350 MG/ML SOLN IV. No oral contrast. COMPARISON:  07/17/2017 FINDINGS: Lower chest: Lung bases clear Hepatobiliary: Small hepatic cysts  unchanged. Gallbladder and liver otherwise normal appearance Pancreas: Normal appearance Spleen: Normal appearance.  Small splenule anterior to spleen. Adrenals/Urinary Tract: Adrenal glands, kidneys, ureters, and bladder normal appearance Stomach/Bowel: Normal appendix. Diverticulosis of distal descending and sigmoid colon with focal wall thickening and inflammatory changes at the proximal to mid sigmoid colon consistent with acute diverticulitis. Small fluid collection adjacent to versus within the colonic wall  measuring 13 x 8 mm, question tiny abscess. No definite extraluminal gas. Stomach and remaining bowel loops normal appearance. Vascular/Lymphatic: Aorta normal caliber without aneurysm or dissection. Minimal atherosclerotic calcification aorta. No adenopathy. Vascular structures patent. Reproductive: Prostate gland and seminal vesicles unremarkable. Other: No free air or free fluid. Probable small RIGHT inguinal hernia containing fat. Musculoskeletal: Mild degenerative disc disease changes L5-S1. IMPRESSION: Acute sigmoid diverticulitis changes with a 13 x 8 mm diameter fluid collection adjacent to versus within the colonic wall, suspicious for tiny abscess. No definite extraluminal gas identified. Small hepatic cysts. Probable small RIGHT inguinal hernia containing fat. Aortic Atherosclerosis (ICD10-I70.0). Electronically Signed   By: Lavonia Dana M.D.   On: 01/08/2021 14:04      Assessment/Plan Diverticulitis with possible microperforation and small fluid collection The patient has evidence of diverticulitis with and a small fluid collection which may be extra-mucosal vs in the colon wall.  He has no free air or other complicating features.  His WBC is normal, and his temp is normal.  We will treat him conservatively with Rocephin/Flagyl and CLD to start.  We discussed he will need a c-scope in about 6-8 weeks.  He will need a GI referral as he does not have a GI doc around here.  We also discussed  her will need to get a PCP as he doesn't have one either.  We did discuss that if he worsens, he may require repeat CT scan +/- drain pending abscess formation, vs Hartman's.  He understands and is agreeable to proceed with admission and conservative management.   FEN - CLD/IVFs VTE - lovenox ID - Rocephin/Flagyl Admit - observation  Henreitta Cea, [facility] 01/08/2021, 3:44 PM Please see Amion for pager number during day hours 7:00am-4:30pm or 7:00am -11:30am on weekends

## 2021-01-08 NOTE — ED Provider Notes (Signed)
Emergency Medicine Provider Triage Evaluation Note  Kevin Mckinney , a 65 y.o. male  was evaluated in triage.  Pt complains of crampy lower abdominal pain that began on Tuesday morning.  He states he went to [facility] on Monday evening.  He sometimes has some crampy abdominal pain after having milkshakes from there.  However, the pain has continued and has radiated into the left groin.  He now has some pain from the right flank area.  It is worse with movement.  He has had some nausea but no vomiting.  He has taken p.o. in but states it is less than normal..  Review of Systems  Positive: No fever, chills, UTI symptoms, Negative: Patient reports that he had a dark stool this morning.  Physical Exam  BP (!) 145/92   Pulse 85   Temp 99.1 F (37.3 C) (Oral)   Resp 16   Ht 1.753 m ('5\' 9"'$ )   Wt 100.7 kg   SpO2 99%   BMI 32.78 kg/m  Gen:   Awake, no distress   Resp:  Normal effort lung sounds clear MSK:   Moves extremities without difficulty  Other:  Diffuse mild tenderness palpation bilateral lower abdomen  Medical Decision Making  Medically screening exam initiated at 11:20 AM.  Appropriate orders placed.  SPARTACUS SETZER was informed that the remainder of the evaluation will be completed by another provider, this initial triage assessment does not replace that evaluation, and the importance of remaining in the ED until their evaluation is complete.  Labs done per protocol with mildly elevated lipase.  Hemoglobin and white count normal.  Urinalysis pending. CT abdomen pelvis and Hemoccult added.   Pattricia Boss, MD 01/08/21 [phone]

## 2021-01-09 ENCOUNTER — Other Ambulatory Visit: Payer: Self-pay

## 2021-01-09 DIAGNOSIS — K5780 Diverticulitis of intestine, part unspecified, with perforation and abscess without bleeding: Secondary | ICD-10-CM | POA: Diagnosis present

## 2021-01-09 DIAGNOSIS — Z20822 Contact with and (suspected) exposure to covid-19: Secondary | ICD-10-CM | POA: Diagnosis present

## 2021-01-09 DIAGNOSIS — E739 Lactose intolerance, unspecified: Secondary | ICD-10-CM | POA: Diagnosis present

## 2021-01-09 DIAGNOSIS — K649 Unspecified hemorrhoids: Secondary | ICD-10-CM | POA: Diagnosis present

## 2021-01-09 DIAGNOSIS — R195 Other fecal abnormalities: Secondary | ICD-10-CM | POA: Diagnosis present

## 2021-01-09 DIAGNOSIS — K5720 Diverticulitis of large intestine with perforation and abscess without bleeding: Secondary | ICD-10-CM | POA: Diagnosis present

## 2021-01-09 DIAGNOSIS — Z79899 Other long term (current) drug therapy: Secondary | ICD-10-CM | POA: Diagnosis not present

## 2021-01-09 DIAGNOSIS — R188 Other ascites: Secondary | ICD-10-CM | POA: Diagnosis present

## 2021-01-09 LAB — CBC
HCT: 35.3 % — ABNORMAL LOW (ref 39.0–52.0)
Hemoglobin: 11.6 g/dL — ABNORMAL LOW (ref 13.0–17.0)
MCH: 29.7 pg (ref 26.0–34.0)
MCHC: 32.9 g/dL (ref 30.0–36.0)
MCV: 90.3 fL (ref 80.0–100.0)
Platelets: 177 10*3/uL (ref 150–400)
RBC: 3.91 MIL/uL — ABNORMAL LOW (ref 4.22–5.81)
RDW: 12.3 % (ref 11.5–15.5)
WBC: 6.2 10*3/uL (ref 4.0–10.5)
nRBC: 0 % (ref 0.0–0.2)

## 2021-01-09 LAB — BASIC METABOLIC PANEL
Anion gap: 4 — ABNORMAL LOW (ref 5–15)
BUN: 10 mg/dL (ref 8–23)
CO2: 27 mmol/L (ref 22–32)
Calcium: 8.2 mg/dL — ABNORMAL LOW (ref 8.9–10.3)
Chloride: 106 mmol/L (ref 98–111)
Creatinine, Ser: 1.25 mg/dL — ABNORMAL HIGH (ref 0.61–1.24)
GFR, Estimated: >60 mL/min (ref >60)
Glucose, Bld: 95 mg/dL (ref 70–99)
Potassium: 3.9 mmol/L (ref 3.5–5.1)
Sodium: 137 mmol/L (ref 135–145)

## 2021-01-09 NOTE — Progress Notes (Signed)
Subjective/Chief Complaint: Feeling much better. Endorses bowel function, pain significantly improved, and is hungry   Objective: Vital signs in last 24 hours: Temp:  [97.7 F (36.5 C)-99.1 F (37.3 C)] 98.2 F (36.8 C) (07/23 0618) Pulse Rate:  [50-85] 53 (07/23 0618) Resp:  [15-17] 16 (07/23 0618) BP: (119-149)/(74-113) 119/74 (07/23 0618) SpO2:  [98 %-100 %] 100 % (07/23 0618) Weight:  [100.7 kg] 100.7 kg (07/22 0829) Last BM Date: 01/07/21  Intake/Output from previous day: 07/22 0701 - 07/23 0700 In: 1475 [I.V.:675; IV Piggyback:800] Out: 650 [Urine:650] Intake/Output this shift: No intake/output data recorded.  General appearance: alert and cooperative Resp: clear to auscultation bilaterally Cardio: regular rate and rhythm GI: soft, non-tender; bowel sounds normal; no masses,  no organomegaly Skin: Skin color, texture, turgor normal. No rashes or lesions Neurologic: Grossly normal  Lab Results:  Recent Labs    01/08/21 0831  WBC 8.3  HGB 12.7*  HCT 38.8*  PLT 197   BMET Recent Labs    01/08/21 0831  NA 139  K 4.0  CL 107  CO2 26  GLUCOSE 107*  BUN 12  CREATININE 1.22  CALCIUM 8.6*   PT/INR No results for input(s): LABPROT, INR in the last 72 hours. ABG No results for input(s): PHART, HCO3 in the last 72 hours.  Invalid input(s): PCO2, PO2  Studies/Results: CT ABDOMEN PELVIS W CONTRAST  Result Date: 01/08/2021 CLINICAL DATA:  Lower abdominal and groin pain since Monday, pain with walking and moving, now having black stools this morning EXAM: CT ABDOMEN AND PELVIS WITH CONTRAST TECHNIQUE: Multidetector CT imaging of the abdomen and pelvis was performed using the standard protocol following bolus administration of intravenous contrast. Sagittal and coronal MPR images reconstructed from axial data set. CONTRAST:  120m OMNIPAQUE IOHEXOL 350 MG/ML SOLN IV. No oral contrast. COMPARISON:  07/17/2017 FINDINGS: Lower chest: Lung bases clear  Hepatobiliary: Small hepatic cysts unchanged. Gallbladder and liver otherwise normal appearance Pancreas: Normal appearance Spleen: Normal appearance.  Small splenule anterior to spleen. Adrenals/Urinary Tract: Adrenal glands, kidneys, ureters, and bladder normal appearance Stomach/Bowel: Normal appendix. Diverticulosis of distal descending and sigmoid colon with focal wall thickening and inflammatory changes at the proximal to mid sigmoid colon consistent with acute diverticulitis. Small fluid collection adjacent to versus within the colonic wall measuring 13 x 8 mm, question tiny abscess. No definite extraluminal gas. Stomach and remaining bowel loops normal appearance. Vascular/Lymphatic: Aorta normal caliber without aneurysm or dissection. Minimal atherosclerotic calcification aorta. No adenopathy. Vascular structures patent. Reproductive: Prostate gland and seminal vesicles unremarkable. Other: No free air or free fluid. Probable small RIGHT inguinal hernia containing fat. Musculoskeletal: Mild degenerative disc disease changes L5-S1. IMPRESSION: Acute sigmoid diverticulitis changes with a 13 x 8 mm diameter fluid collection adjacent to versus within the colonic wall, suspicious for tiny abscess. No definite extraluminal gas identified. Small hepatic cysts. Probable small RIGHT inguinal hernia containing fat. Aortic Atherosclerosis (ICD10-I70.0). Electronically Signed   By: MLavonia DanaM.D.   On: 01/08/2021 14:04    Anti-infectives: Anti-infectives (From admission, onward)    Start     Dose/Rate Route Frequency Ordered Stop   01/09/21 1400  cefTRIAXone (ROCEPHIN) 2 g in sodium chloride 0.9 % 100 mL IVPB       See Hyperspace for full Linked Orders Report.   2 g 200 mL/hr over 30 Minutes Intravenous Every 24 hours 01/08/21 1544     01/08/21 2300  metroNIDAZOLE (FLAGYL) IVPB 500 mg       See  Hyperspace for full Linked Orders Report.   500 mg 100 mL/hr over 60 Minutes Intravenous Every 8 hours 01/08/21  1544     01/08/21 1430  metroNIDAZOLE (FLAGYL) IVPB 500 mg        500 mg 100 mL/hr over 60 Minutes Intravenous  Once 01/08/21 1418 01/08/21 1606   01/08/21 1430  cefTRIAXone (ROCEPHIN) 2 g in sodium chloride 0.9 % 100 mL IVPB        2 g 200 mL/hr over 30 Minutes Intravenous  Once 01/08/21 1418 01/08/21 1525       Assessment/Plan: Diverticulitis with possible microperforation and small fluid collection The patient has evidence of diverticulitis with and a small fluid collection which may be extra-mucosal vs in the colon wall.  He has no free air or other complicating features.  His WBC is normal, and his temp is normal.  We will treat him conservatively with Rocephin/Flagyl and CLD to start.  We discussed he will need a c-scope in about 6-8 weeks.  He will need a GI referral as he does not have a GI doc around here.  We also discussed her will need to get a PCP as he doesn't have one either.  We did discuss that if he worsens, he may require repeat CT scan +/- drain pending abscess formation, vs Hartman's.   -Good improvement overnight. Try full liquids today. Continue IV abx, mobilize     FEN - CLD/IVFs VTE - lovenox ID - Rocephin/Flagyl Admit - observation  LOS: 0 days    Clovis Riley 01/09/2021

## 2021-01-10 LAB — BASIC METABOLIC PANEL
Anion gap: 7 (ref 5–15)
BUN: 9 mg/dL (ref 8–23)
CO2: 25 mmol/L (ref 22–32)
Calcium: 8.5 mg/dL — ABNORMAL LOW (ref 8.9–10.3)
Chloride: 107 mmol/L (ref 98–111)
Creatinine, Ser: 1.17 mg/dL (ref 0.61–1.24)
GFR, Estimated: >60 mL/min (ref >60)
Glucose, Bld: 87 mg/dL (ref 70–99)
Potassium: 3.8 mmol/L (ref 3.5–5.1)
Sodium: 139 mmol/L (ref 135–145)

## 2021-01-10 LAB — CBC
HCT: 35.3 % — ABNORMAL LOW (ref 39.0–52.0)
Hemoglobin: 11.8 g/dL — ABNORMAL LOW (ref 13.0–17.0)
MCH: 29.6 pg (ref 26.0–34.0)
MCHC: 33.4 g/dL (ref 30.0–36.0)
MCV: 88.7 fL (ref 80.0–100.0)
Platelets: 193 10*3/uL (ref 150–400)
RBC: 3.98 MIL/uL — ABNORMAL LOW (ref 4.22–5.81)
RDW: 11.9 % (ref 11.5–15.5)
WBC: 5.1 10*3/uL (ref 4.0–10.5)
nRBC: 0 % (ref 0.0–0.2)

## 2021-01-10 LAB — MAGNESIUM: Magnesium: 2 mg/dL (ref 1.7–2.4)

## 2021-01-10 MED ORDER — METRONIDAZOLE 500 MG PO TABS
500.0000 mg | ORAL_TABLET | Freq: Three times a day (TID) | ORAL | Status: DC
  Administered 2021-01-10 – 2021-01-11 (×3): 500 mg via ORAL
  Filled 2021-01-10 (×3): qty 1

## 2021-01-10 MED ORDER — CIPROFLOXACIN HCL 500 MG PO TABS
500.0000 mg | ORAL_TABLET | Freq: Two times a day (BID) | ORAL | Status: DC
  Administered 2021-01-10 – 2021-01-11 (×3): 500 mg via ORAL
  Filled 2021-01-10 (×3): qty 1

## 2021-01-10 NOTE — Progress Notes (Signed)
Subjective/Chief Complaint: Feels well. Tolerating FLD without issue. Minimal pain.    Objective: Vital signs in last 24 hours: Temp:  [97.5 F (36.4 C)-98.3 F (36.8 C)] 98.3 F (36.8 C) (07/24 0932) Pulse Rate:  [50-61] 61 (07/24 0932) Resp:  [16-18] 16 (07/24 0932) BP: (120-152)/(85-90) 152/85 (07/24 0932) SpO2:  [99 %-100 %] 100 % (07/24 0932) Last BM Date: 01/09/21  Intake/Output from previous day: 07/23 0701 - 07/24 0700 In: 237 [P.O.:237] Out: -  Intake/Output this shift: No intake/output data recorded.  General appearance: alert and cooperative Resp: clear to auscultation bilaterally Cardio: regular rate and rhythm GI: soft, non-tender; bowel sounds normal; no masses,  no organomegaly Skin: Skin color, texture, turgor normal. No rashes or lesions Neurologic: Grossly normal  Lab Results:  Recent Labs    01/09/21 0751 01/10/21 0637  WBC 6.2 5.1  HGB 11.6* 11.8*  HCT 35.3* 35.3*  PLT 177 193   BMET Recent Labs    01/09/21 0751 01/10/21 0637  NA 137 139  K 3.9 3.8  CL 106 107  CO2 27 25  GLUCOSE 95 87  BUN 10 9  CREATININE 1.25* 1.17  CALCIUM 8.2* 8.5*   PT/INR No results for input(s): LABPROT, INR in the last 72 hours. ABG No results for input(s): PHART, HCO3 in the last 72 hours.  Invalid input(s): PCO2, PO2  Studies/Results: CT ABDOMEN PELVIS W CONTRAST  Result Date: 01/08/2021 CLINICAL DATA:  Lower abdominal and groin pain since Monday, pain with walking and moving, now having black stools this morning EXAM: CT ABDOMEN AND PELVIS WITH CONTRAST TECHNIQUE: Multidetector CT imaging of the abdomen and pelvis was performed using the standard protocol following bolus administration of intravenous contrast. Sagittal and coronal MPR images reconstructed from axial data set. CONTRAST:  162m OMNIPAQUE IOHEXOL 350 MG/ML SOLN IV. No oral contrast. COMPARISON:  07/17/2017 FINDINGS: Lower chest: Lung bases clear Hepatobiliary: Small hepatic cysts  unchanged. Gallbladder and liver otherwise normal appearance Pancreas: Normal appearance Spleen: Normal appearance.  Small splenule anterior to spleen. Adrenals/Urinary Tract: Adrenal glands, kidneys, ureters, and bladder normal appearance Stomach/Bowel: Normal appendix. Diverticulosis of distal descending and sigmoid colon with focal wall thickening and inflammatory changes at the proximal to mid sigmoid colon consistent with acute diverticulitis. Small fluid collection adjacent to versus within the colonic wall measuring 13 x 8 mm, question tiny abscess. No definite extraluminal gas. Stomach and remaining bowel loops normal appearance. Vascular/Lymphatic: Aorta normal caliber without aneurysm or dissection. Minimal atherosclerotic calcification aorta. No adenopathy. Vascular structures patent. Reproductive: Prostate gland and seminal vesicles unremarkable. Other: No free air or free fluid. Probable small RIGHT inguinal hernia containing fat. Musculoskeletal: Mild degenerative disc disease changes L5-S1. IMPRESSION: Acute sigmoid diverticulitis changes with a 13 x 8 mm diameter fluid collection adjacent to versus within the colonic wall, suspicious for tiny abscess. No definite extraluminal gas identified. Small hepatic cysts. Probable small RIGHT inguinal hernia containing fat. Aortic Atherosclerosis (ICD10-I70.0). Electronically Signed   By: MLavonia DanaM.D.   On: 01/08/2021 14:04    Anti-infectives: Anti-infectives (From admission, onward)    Start     Dose/Rate Route Frequency Ordered Stop   01/10/21 1400  metroNIDAZOLE (FLAGYL) tablet 500 mg        500 mg Oral Every 8 hours 01/10/21 0938     01/10/21 1030  ciprofloxacin (CIPRO) tablet 500 mg        500 mg Oral 2 times daily 01/10/21 0938     01/09/21 1400  cefTRIAXone (ROCEPHIN)  2 g in sodium chloride 0.9 % 100 mL IVPB  Status:  Discontinued       See Hyperspace for full Linked Orders Report.   2 g 200 mL/hr over 30 Minutes Intravenous Every 24  hours 01/08/21 1544 01/10/21 0938   01/08/21 2300  metroNIDAZOLE (FLAGYL) IVPB 500 mg  Status:  Discontinued       See Hyperspace for full Linked Orders Report.   500 mg 100 mL/hr over 60 Minutes Intravenous Every 8 hours 01/08/21 1544 01/10/21 0938   01/08/21 1430  metroNIDAZOLE (FLAGYL) IVPB 500 mg        500 mg 100 mL/hr over 60 Minutes Intravenous  Once 01/08/21 1418 01/08/21 1606   01/08/21 1430  cefTRIAXone (ROCEPHIN) 2 g in sodium chloride 0.9 % 100 mL IVPB        2 g 200 mL/hr over 30 Minutes Intravenous  Once 01/08/21 1418 01/08/21 1525       Assessment/Plan: Diverticulitis with possible microperforation and small fluid collection The patient has evidence of diverticulitis with and a small fluid collection which may be extra-mucosal vs in the colon wall.  He has no free air or other complicating features.  His WBC is normal, and his temp is normal.  We will treat him conservatively with Rocephin/Flagyl and CLD to start.  We discussed he will need a c-scope in about 6-8 weeks.  He will need a GI referral as he does not have a GI doc around here.  We also discussed her will need to get a PCP as he doesn't have one either.  We did discuss that if he worsens, he may require repeat CT scan +/- drain pending abscess formation, vs Hartman's.   -Progressing well. Advance to soft diet, change to PO abx. Likely home tomorrow.      FEN - soft VTE - lovenox ID - Rocephin/Flagyl (7/22-24); PO Cipro/flagyl 7/24-> Admit - observation  LOS: 1 day    Clovis Riley 01/10/2021

## 2021-01-11 MED ORDER — ONDANSETRON 4 MG PO TBDP: 4.0000 mg | ORAL_TABLET | Freq: Four times a day (QID) | ORAL | 0 refills | Status: DC | PRN

## 2021-01-11 MED ORDER — ACETAMINOPHEN 500 MG PO TABS
1000.0000 mg | ORAL_TABLET | Freq: Four times a day (QID) | ORAL | 0 refills | Status: DC | PRN
Start: 2021-01-11 — End: 2022-11-27

## 2021-01-11 MED ORDER — AMOXICILLIN-POT CLAVULANATE 875-125 MG PO TABS: 1.0000 | ORAL_TABLET | Freq: Two times a day (BID) | ORAL | 0 refills | Status: AC

## 2021-01-11 NOTE — Progress Notes (Signed)
AVS given and reviewed with pt. Medications discussed. All questions answered to satisfaction. Pt verbalized understanding of information given. Pt escorted off the unit with all belongings via wheelchair by volunteer services.  

## 2021-01-11 NOTE — Discharge Instructions (Signed)
Maintain a low fiber diet eating plan for 4-6 weeks and then transition to high fiber

## 2021-01-11 NOTE — Progress Notes (Signed)
   01/11/21 1330  Clinical Encounter Type  Visited With Other (Comment) (Patient discharged)  Visit Type Initial  Referral From Nurse  Consult/Referral To Chaplain   Chaplain responded to consult request to provide Advanced Directive education. Per nurse patient was discharged today. This note was prepared by Jeanine Luz, M.Div..  For questions please contact by phone [phone].

## 2021-01-11 NOTE — Discharge Summary (Signed)
Patient ID: Kevin Mckinney [id number] Jul 29, 1955 65 y.o.  Admit date: 01/08/2021 Discharge date: 01/11/2021  Admitting Diagnosis: Diverticulitis with  microperforation  Discharge Diagnosis Patient Active Problem List   Diagnosis Date Noted   Diverticulitis large intestine 01/08/2021   Diverticulitis 01/08/2021    Consultants none  Reason for Admission: This is a 65 yo black male with a history of hemorrhoids who is also lactose intolerant and drank a Frosty from [facility] on Tuesday and developed abdominal pain the following morning on the way to work.  He thought this was secondary to his lactose intolerance.  His pain started out diffuse but has migrated to his LLQ.  He denies any fevers or chills, CP, SOB, dysuria.  He took pepto bismol and states his most recent stools have been black.  He does have a history of hemorrhoids and occasionally will have BRBPR, but not recently.  He has had several colonoscopies in the past with no malignant findings per his report.  His most recent was over 10 yrs ago.  These were not done by anyone around here.     He continued to have pain despite pepto-bismol, ibuprofen, and other conservative measures at home.  Due to persistent pain he presented to the ED today where he was found to have a normal WBC, no fever, but a CT scan concerning for diverticulitis with a small possible fluid collection about 13x29m, which may be in the colon wall.  We have been asked to see him for further evaluation and admission.  Procedures none  Hospital Course:  The patient was admitted and placed on Rocephin/Flagyl.  He was initially NPO for bowel rest.  His pain gradually improved and his diet was able to be advanced as tolerated.  He was transitioned to oral abx therapy and stable for DC home on HD 3 on a low fiber diet.  He will need to stay on this for 4-6 weeks and then transition to high fiber diet.  Our office will make a referral to GI for routine screening  colonoscopy in 6-8 weeks.  He has been instructed to obtain a PCP.  He does not need surgical follow up at this time as this is his first episode with no needs for surgical intervention currently.  He was otherwise stable at this time for DC home.  Physical Exam: Heart: regular Lungs: CTAB Abd: soft, NT, ND, +BS  Allergies as of 01/11/2021   No Known Allergies      Medication List     TAKE these medications    acetaminophen 500 MG tablet Commonly known as: TYLENOL Take 2 tablets (1,000 mg total) by mouth every 6 (six) hours as needed for mild pain, fever or headache.   amoxicillin-clavulanate 875-125 MG tablet Commonly known as: Augmentin Take 1 tablet by mouth 2 (two) times daily for 7 days.   ibuprofen 200 MG tablet Commonly known as: ADVIL Take 400 mg by mouth at bedtime as needed for moderate pain.   ondansetron 4 MG disintegrating tablet Commonly known as: ZOFRAN-ODT Take 1 tablet (4 mg total) by mouth every 6 (six) hours as needed for nausea.   psyllium 58.6 % powder Commonly known as: METAMUCIL Take 1 packet by mouth daily as needed (constipation).          Follow-up Information     obtain a primary care provider Follow up.          gastroenterology Follow up.   Why: Our office will send  a referral in and the GI office should call you to arrange a 6-8 week follow up for a colonoscopy        Surgery, [place] Follow up today.   Specialty: General Surgery Why: As needed Call if you have any questions or concerns Contact information: [address] [place] [address] [phone]                 Signed: Saverio Danker, [facility] 01/11/2021, 12:31 PM Please see Amion for pager number during day hours 7:00am-4:30pm, 7-11:30am on Weekends

## 2021-02-02 ENCOUNTER — Emergency Department (HOSPITAL_COMMUNITY)
Admission: EM | Admit: 2021-02-02 | Discharge: 2021-02-02 | Disposition: A | Discharge status: Home / Self Care | Payer: Medicare Other | Attending: Emergency Medicine | Admitting: Emergency Medicine

## 2021-02-02 ENCOUNTER — Other Ambulatory Visit: Payer: Self-pay

## 2021-02-02 ENCOUNTER — Emergency Department (HOSPITAL_COMMUNITY): Payer: Medicare Other

## 2021-02-02 ENCOUNTER — Encounter (HOSPITAL_COMMUNITY): Payer: Self-pay

## 2021-02-02 DIAGNOSIS — R1032 Left lower quadrant pain: Secondary | ICD-10-CM | POA: Diagnosis present

## 2021-02-02 DIAGNOSIS — K5732 Diverticulitis of large intestine without perforation or abscess without bleeding: Secondary | ICD-10-CM | POA: Diagnosis not present

## 2021-02-02 DIAGNOSIS — K5792 Diverticulitis of intestine, part unspecified, without perforation or abscess without bleeding: Secondary | ICD-10-CM

## 2021-02-02 LAB — COMPREHENSIVE METABOLIC PANEL
ALT: 22 U/L (ref 0–44)
AST: 19 U/L (ref 15–41)
Albumin: 3.5 g/dL (ref 3.5–5.0)
Alkaline Phosphatase: 70 U/L (ref 38–126)
Anion gap: 8 (ref 5–15)
BUN: 15 mg/dL (ref 8–23)
CO2: 26 mmol/L (ref 22–32)
Calcium: 9.1 mg/dL (ref 8.9–10.3)
Chloride: 103 mmol/L (ref 98–111)
Creatinine, Ser: 1.24 mg/dL (ref 0.61–1.24)
GFR, Estimated: >60 mL/min (ref >60)
Glucose, Bld: 118 mg/dL — ABNORMAL HIGH (ref 70–99)
Potassium: 3.7 mmol/L (ref 3.5–5.1)
Sodium: 137 mmol/L (ref 135–145)
Total Bilirubin: 1 mg/dL (ref 0.3–1.2)
Total Protein: 7 g/dL (ref 6.5–8.1)

## 2021-02-02 LAB — URINALYSIS, ROUTINE W REFLEX MICROSCOPIC
Bacteria, UA: NONE SEEN
Bilirubin Urine: NEGATIVE
Glucose, UA: NEGATIVE mg/dL
Ketones, ur: NEGATIVE mg/dL
Leukocytes,Ua: NEGATIVE
Nitrite: NEGATIVE
Protein, ur: NEGATIVE mg/dL
Specific Gravity, Urine: 1.02 (ref 1.005–1.030)
pH: 5 (ref 5.0–8.0)

## 2021-02-02 LAB — CBC WITH DIFFERENTIAL/PLATELET
Abs Immature Granulocytes: 0.02 10*3/uL (ref 0.00–0.07)
Basophils Absolute: 0.1 10*3/uL (ref 0.0–0.1)
Basophils Relative: 1 %
Eosinophils Absolute: 0.1 10*3/uL (ref 0.0–0.5)
Eosinophils Relative: 1 %
HCT: 41.8 % (ref 39.0–52.0)
Hemoglobin: 13.6 g/dL (ref 13.0–17.0)
Immature Granulocytes: 0 %
Lymphocytes Relative: 16 %
Lymphs Abs: 1.4 10*3/uL (ref 0.7–4.0)
MCH: 29.5 pg (ref 26.0–34.0)
MCHC: 32.5 g/dL (ref 30.0–36.0)
MCV: 90.7 fL (ref 80.0–100.0)
Monocytes Absolute: 0.5 10*3/uL (ref 0.1–1.0)
Monocytes Relative: 6 %
Neutro Abs: 6.8 10*3/uL (ref 1.7–7.7)
Neutrophils Relative %: 76 %
Platelets: 219 10*3/uL (ref 150–400)
RBC: 4.61 MIL/uL (ref 4.22–5.81)
RDW: 12.6 % (ref 11.5–15.5)
WBC: 8.9 10*3/uL (ref 4.0–10.5)
nRBC: 0 % (ref 0.0–0.2)

## 2021-02-02 LAB — LACTIC ACID, PLASMA: Lactic Acid, Venous: 1 mmol/L (ref 0.5–1.9)

## 2021-02-02 LAB — LIPASE, BLOOD: Lipase: 34 U/L (ref 11–51)

## 2021-02-02 MED ORDER — HYDROCODONE-ACETAMINOPHEN 5-325 MG PO TABS: 2.0000 | ORAL_TABLET | ORAL | 0 refills | Status: DC | PRN

## 2021-02-02 MED ORDER — AMOXICILLIN-POT CLAVULANATE 875-125 MG PO TABS
1.0000 | ORAL_TABLET | Freq: Once | ORAL | Status: AC
  Administered 2021-02-02: 1 via ORAL
  Filled 2021-02-02: qty 1

## 2021-02-02 MED ORDER — ONDANSETRON 4 MG PO TBDP: 4.0000 mg | ORAL_TABLET | Freq: Three times a day (TID) | ORAL | 0 refills | Status: DC | PRN

## 2021-02-02 MED ORDER — AMOXICILLIN-POT CLAVULANATE 875-125 MG PO TABS: 1.0000 | ORAL_TABLET | Freq: Two times a day (BID) | ORAL | 0 refills | Status: AC

## 2021-02-02 MED ORDER — OXYCODONE-ACETAMINOPHEN 5-325 MG PO TABS
1.0000 | ORAL_TABLET | ORAL | Status: AC
Start: 2021-02-02 — End: 2021-02-02
  Administered 2021-02-02: 1 via ORAL
  Filled 2021-02-02: qty 1

## 2021-02-02 MED ORDER — IOHEXOL 350 MG/ML SOLN
75.0000 mL | Freq: Once | INTRAVENOUS | Status: AC | PRN
  Administered 2021-02-02: 75 mL via INTRAVENOUS

## 2021-02-02 MED ORDER — HYDROCODONE-ACETAMINOPHEN 5-325 MG PO TABS
1.0000 | ORAL_TABLET | Freq: Once | ORAL | Status: AC
  Administered 2021-02-02: 1 via ORAL
  Filled 2021-02-02: qty 1

## 2021-02-02 NOTE — Discharge Instructions (Addendum)
Please follow-up with the general surgery and gastroenterologist team that you have been recommended to.  Please refer to your last paperwork for this.  Use Tylenol 1000 mg every 6 hours.  Drink plenty of water take Metamucil as discussed.  I recommend taking 1 packet by mouth in the morning and 1 packet by mouth in the evening.  Again drinking water is vitally important here.  I also prescribed you a few tablets of the nausea medicine to use if needed.  I have also prescribed you 4 tablets of Norco to use if you have severe/breakthrough pain.

## 2021-02-02 NOTE — ED Provider Notes (Signed)
[facility] EMERGENCY DEPARTMENT Provider Note   CSN: [id number] Arrival date & time: 02/02/21  1111     History Chief Complaint  Patient presents with   Abdominal Pain    Kevin Mckinney is a 65 y.o. male.  HPI Kevin Mckinney, a 65 y.o. male  was evaluated in triage.  Pt complains of left lower quadrant abdominal pain ongoing for the past approximately 24 hours.  He states it is achy comfortable.  Endorses decreased appetite.  States he has had some blood in his stool states this may be due to hemorrhoids  Notably patient was admitted to the hospital 01/08/2021 was admitted for 3 days with antibiotics.  Notably during that admission patient had a concerning small possible fluid collection 13 x 8 mm in size.  He was admitted to general surgery was treated with antibiotics and improved.  Discharged home with antibiotics.  He states his symptoms completely resolved.  Patient states he has had some nausea but no vomiting.  Denies any chest pain shortness of breath lightheadedness or dizziness.  He states that the pain in his left lower abdomen has been constant over the past 24 hours he states that he has been worsening at times but mostly persistent.  Achy and constant.  No significant aggravating mitigating factors.  He states that he has had some BRBPR however notes that he has hemorrhoids and thinks it is from this.  Denies any fevers.     Past Medical History:  Diagnosis Date   Hemorrhoids     Patient Active Problem List   Diagnosis Date Noted   Diverticulitis large intestine 01/08/2021   Diverticulitis 01/08/2021    Past Surgical History:  Procedure Laterality Date   HAND SURGERY         No family history on file.  Social History   Tobacco Use   Smoking status: Never   Smokeless tobacco: Never  Substance Use Topics   Alcohol use: No   Drug use: No    Home Medications Prior to Admission medications   Medication Sig Start Date End Date  Taking? Authorizing Provider  amoxicillin-clavulanate (AUGMENTIN) 875-125 MG tablet Take 1 tablet by mouth every 12 (twelve) hours for 10 days. 02/02/21 02/12/21 Yes Keamber Macfadden, Ova Freshwater S, PA  HYDROcodone-acetaminophen (NORCO/VICODIN) 5-325 MG tablet Take 2 tablets by mouth every 4 (four) hours as needed. 02/02/21  Yes Valerie Fredin S, PA  ondansetron (ZOFRAN ODT) 4 MG disintegrating tablet Take 1 tablet (4 mg total) by mouth every 8 (eight) hours as needed for nausea or vomiting. 02/02/21  Yes Eulis Salazar S, PA  acetaminophen (TYLENOL) 500 MG tablet Take 2 tablets (1,000 mg total) by mouth every 6 (six) hours as needed for mild pain, fever or headache. 01/11/21   Saverio Danker, PA-C  ibuprofen (ADVIL) 200 MG tablet Take 400 mg by mouth at bedtime as needed for moderate pain.    [provider]  psyllium (METAMUCIL) 58.6 % powder Take 1 packet by mouth daily as needed (constipation).    [provider]    Allergies    Patient has no known allergies.  Review of Systems   Review of Systems  Constitutional:  Negative for chills and fever.  HENT:  Negative for congestion.   Eyes:  Negative for pain.  Respiratory:  Negative for cough and shortness of breath.   Cardiovascular:  Negative for chest pain and leg swelling.  Gastrointestinal:  Positive for abdominal pain, constipation and nausea. Negative for  vomiting.  Genitourinary:  Negative for dysuria.  Musculoskeletal:  Negative for myalgias.  Skin:  Negative for rash.  Neurological:  Negative for dizziness and headaches.   Physical Exam Updated Vital Signs BP 131/87   Pulse 78   Temp 98.1 F (36.7 C) (Oral)   Resp 16   Ht 6' (1.829 m)   Wt 100.7 kg   SpO2 98%   BMI 30.11 kg/m   Physical Exam Vitals and nursing note reviewed.  Constitutional:      General: He is not in acute distress.    Comments: Pleasant well-appearing 65 year old.  In no acute distress.  Sitting comfortably in bed.  Able answer questions  appropriately follow commands. No increased work of breathing. Speaking in full sentences.   HENT:     Head: Normocephalic and atraumatic.     Nose: Nose normal.  Eyes:     General: No scleral icterus. Cardiovascular:     Rate and Rhythm: Normal rate and regular rhythm.     Pulses: Normal pulses.     Heart sounds: Normal heart sounds.  Pulmonary:     Effort: Pulmonary effort is normal. No respiratory distress.     Breath sounds: No wheezing.  Abdominal:     Palpations: Abdomen is soft.     Tenderness: There is abdominal tenderness in the left lower quadrant.     Comments: Tenderness to palpation the left lower quadrant of the abdomen.  No guarding or rebound.  Musculoskeletal:     Cervical back: Normal range of motion.     Right lower leg: No edema.     Left lower leg: No edema.  Skin:    General: Skin is warm and dry.     Capillary Refill: Capillary refill takes less than 2 seconds.  Neurological:     Mental Status: He is alert. Mental status is at baseline.  Psychiatric:        Mood and Affect: Mood normal.        Behavior: Behavior normal.    ED Results / Procedures / Treatments   Labs (all labs ordered are listed, but only abnormal results are displayed) Labs Reviewed  COMPREHENSIVE METABOLIC PANEL - Abnormal; Notable for the following components:      Result Value   Glucose, Bld 118 (*)    All other components within normal limits  URINALYSIS, ROUTINE W REFLEX MICROSCOPIC - Abnormal; Notable for the following components:   Hgb urine dipstick SMALL (*)    All other components within normal limits  CBC WITH DIFFERENTIAL/PLATELET  LIPASE, BLOOD  LACTIC ACID, PLASMA    EKG None  Radiology CT ABDOMEN PELVIS W CONTRAST  Result Date: 02/02/2021 CLINICAL DATA:  Patient reports abdominal pain and bloody stool since Sunday, history of diverticulitis EXAM: CT ABDOMEN AND PELVIS WITH CONTRAST TECHNIQUE: Multidetector CT imaging of the abdomen and pelvis was performed  using the standard protocol following bolus administration of intravenous contrast. CONTRAST:  46m OMNIPAQUE IOHEXOL 350 MG/ML SOLN COMPARISON:  CT abdomen/pelvis 01/08/2021 FINDINGS: Lower chest: The lung bases are clear. The imaged heart is unremarkable. Hepatobiliary: Subcentimeter hypodense lesions in the liver are too small to characterize, but likely reflects cysts. There is no new or suspicious lesion. There is layering sludge within the gallbladder. There is no evidence of acute cholecystitis. There is no biliary ductal dilatation. Pancreas: The main pancreatic duct is mildly prominent. The pancreatic head/uncinate process is mildly hypoenhancing relative to the remainder of the pancreatic parenchyma (3-33). Spleen: Unremarkable.  A small accessory spleen is unchanged. Adrenals/Urinary Tract: The adrenals are unremarkable. Mild symmetric perinephric stranding is unchanged, nonspecific. The kidneys are otherwise unremarkable, with no focal lesions or calculi. There is no hydronephrosis or hydroureter. The bladder is unremarkable. Stomach/Bowel: The stomach is unremarkable. There is no evidence of bowel obstruction. There is colonic diverticulosis. There is wall thickening with associated inflammatory change surrounding the sigmoid colon centered around a prominent inflamed appearing diverticulum along the posterior wall of the sigmoid colon (3-67). Vascular/Lymphatic: The abdominal aorta is nonaneurysmal. The main portal and splenic veins are patent. A prominent left lower quadrant mesenteric lymph nodes likely reactive. There is no other evidence of pathologic lymphadenopathy in the abdomen or pelvis. Reproductive: The prostate and seminal vesicles are unremarkable. Other: There is no ascites or free air. Musculoskeletal: There is no acute osseous abnormality. Degenerative changes at L5-S1 are similar to the prior study. IMPRESSION: 1. Acute uncomplicated sigmoid diverticulitis, similar in appearance to the  prior study. No evidence of abscess formation or perforation. 2. Hypoenhancement of the pancreatic head/uncinate process relative to the remainder of the parenchyma. Recommend non emergent MRI of the abdomen with and without contrast to exclude underlying mass lesion. Electronically Signed   By: Valetta Mole M.D.   On: 02/02/2021 14:11    Procedures Procedures   Medications Ordered in ED Medications  HYDROcodone-acetaminophen (NORCO/VICODIN) 5-325 MG per tablet 1 tablet (has no administration in time range)  amoxicillin-clavulanate (AUGMENTIN) 875-125 MG per tablet 1 tablet (has no administration in time range)  oxyCODONE-acetaminophen (PERCOCET/ROXICET) 5-325 MG per tablet 1 tablet (1 tablet Oral Given 02/02/21 1208)  iohexol (OMNIPAQUE) 350 MG/ML injection 75 mL (75 mLs Intravenous Contrast Given 02/02/21 1339)    ED Course  I have reviewed the triage vital signs and the nursing notes.  Pertinent labs & imaging results that were available during my care of the patient were reviewed by me and considered in my medical decision making (see chart for details).    MDM Rules/Calculators/A&P                           Patient initially seen in triage I reviewed his CT imaging from the prior hospitalization and given his left lower quadrant abdominal tenderness, prior CT scan concerning for fluid collection and has mild tachycardia will CT scan to evaluate make sure he does not have an enlarging abscess.  Patient feels much improved after 1 Percocet.  His tachycardia resolved without any other treatment his lactic was within normal limits lipase within normal limits.  CMP unremarkable.  CBC without leukocytosis or anemia.  Urinalysis unremarkable.  CT scan does show diverticulitis without any fluid collection or abscess.  There is uncomplicated diverticulitis evident on CT scan.  Small is no finding of hypoechoic area of pancreas he will follow-up with gastroenterology already instructed to discuss  this finding with him as well.  CT scan printed and handed to patient with discharge paperwork.  Patient tolerating p.o.  Ambulatory at discharge.  Return precautions given.  Final Clinical Impression(s) / ED Diagnoses Final diagnoses:  Diverticulitis    Rx / DC Orders ED Discharge Orders          Ordered    ondansetron (ZOFRAN ODT) 4 MG disintegrating tablet  Every 8 hours PRN        02/02/21 1848    HYDROcodone-acetaminophen (NORCO/VICODIN) 5-325 MG tablet  Every 4 hours PRN        02/02/21  1848    amoxicillin-clavulanate (AUGMENTIN) 875-125 MG tablet  Every 12 hours        02/02/21 1848             Tedd Sias, [place] 02/02/21 1859    Jeanell Sparrow, DO 02/03/21 0104

## 2021-02-02 NOTE — ED Triage Notes (Signed)
Pt reports abd pain and bloody stools since Sunday, hx of diverticulitis last month and states this feels the same. Pt also c.o n/v.

## 2021-02-02 NOTE — ED Provider Notes (Signed)
Emergency Medicine Provider Triage Evaluation Note  Kevin Mckinney, a 65 y.o. male  was evaluated in triage.  Pt complains of left lower quadrant abdominal pain ongoing for the past approximately 24 hours.  He states it is achy comfortable.  Endorses decreased appetite.  States he has had some blood in his stool states this may be due to hemorrhoids  Review of Systems  Positive: Abd pain Negative: Nausea  Physical Exam  BP 132/84   Pulse (!) 102   Temp 99.1 F (37.3 C)   Resp 18   Ht 6' (1.829 m)   Wt 100.7 kg   SpO2 98%   BMI 30.11 kg/m  Gen:   Awake, no distress   Resp:  Normal effort  MSK:   Moves extremities without difficulty  Other:  TTP LLQ  Medical Decision Making  Medically screening exam initiated at 12:02 PM.  Appropriate orders placed.  Kevin Mckinney was informed that the remainder of the evaluation will be completed by another provider, this initial triage assessment does not replace that evaluation, and the importance of remaining in the ED until their evaluation is complete.  Reviewed patient's prior CT imaging there was questionable abscess.  Given that he is mildly tachycardic has left lower quadrant abdominal tenderness and is complaining of significant left lower quadrant abdominal pain after complete resolution of symptoms after antibiotics from last hospital stay will review CT scan patient and obtain labs.  Also has some vague urinary symptoms will obtain urinalysis.   Kevin Mckinney, [place] 02/02/21 1216    Kevin Dessert, MD 02/02/21 1441

## 2021-03-22 ENCOUNTER — Encounter (HOSPITAL_COMMUNITY): Payer: Self-pay | Admitting: Radiology

## 2021-04-30 ENCOUNTER — Ambulatory Visit: Payer: Self-pay

## 2021-04-30 NOTE — Telephone Encounter (Signed)
Patient called and he says he received a letter from radiology that says there is an incidental finding. I advised he will need to speak to a provider about that and offered a new patient appointment. I advised the first available at [facility] is 05/29/21. He says he will need to be seen earlier. I advised to go on the [facility] website to find a doctor that way and maybe he can be seen earlier, he verbalized understanding.    Message from Jodie Echevaria sent at 04/30/2021  8:35 AM EST  Patient called in to say that he received an incidental finding letter does not have a PCP but would like an explanation Please call Ph# [phone]    Reason for Disposition . Health Information question, no triage required and triager able to answer question  Answer Assessment - Initial Assessment Questions 1. REASON FOR CALL or QUESTION: "What is your reason for calling today?" or "How can I best help you?" or "What question do you have that I can help answer?"     Incidental finding on letter  Protocols used: Information Only Call - No Triage-A-AH

## 2021-07-22 ENCOUNTER — Other Ambulatory Visit: Payer: Self-pay

## 2021-07-22 ENCOUNTER — Encounter: Payer: Self-pay | Admitting: Nurse Practitioner

## 2021-07-22 ENCOUNTER — Ambulatory Visit (INDEPENDENT_AMBULATORY_CARE_PROVIDER_SITE_OTHER): Payer: Medicare Other | Admitting: Nurse Practitioner

## 2021-07-22 VITALS — BP 134/68 | HR 84 | Temp 98.2°F | Ht >= 80 in | Wt 218.0 lb

## 2021-07-22 DIAGNOSIS — Z1211 Encounter for screening for malignant neoplasm of colon: Secondary | ICD-10-CM

## 2021-07-22 DIAGNOSIS — L608 Other nail disorders: Secondary | ICD-10-CM

## 2021-07-22 DIAGNOSIS — F3289 Other specified depressive episodes: Secondary | ICD-10-CM

## 2021-07-22 DIAGNOSIS — Z13228 Encounter for screening for other metabolic disorders: Secondary | ICD-10-CM

## 2021-07-22 DIAGNOSIS — R935 Abnormal findings on diagnostic imaging of other abdominal regions, including retroperitoneum: Secondary | ICD-10-CM | POA: Diagnosis not present

## 2021-07-22 DIAGNOSIS — K5792 Diverticulitis of intestine, part unspecified, without perforation or abscess without bleeding: Secondary | ICD-10-CM | POA: Diagnosis not present

## 2021-07-22 DIAGNOSIS — N528 Other male erectile dysfunction: Secondary | ICD-10-CM

## 2021-07-22 DIAGNOSIS — Z0000 Encounter for general adult medical examination without abnormal findings: Secondary | ICD-10-CM

## 2021-07-22 DIAGNOSIS — F489 Nonpsychotic mental disorder, unspecified: Secondary | ICD-10-CM | POA: Diagnosis not present

## 2021-07-22 DIAGNOSIS — R351 Nocturia: Secondary | ICD-10-CM

## 2021-07-22 DIAGNOSIS — Z1159 Encounter for screening for other viral diseases: Secondary | ICD-10-CM

## 2021-07-22 DIAGNOSIS — Z2821 Immunization not carried out because of patient refusal: Secondary | ICD-10-CM

## 2021-07-22 MED ORDER — MAGNESIUM 200 MG PO TABS: 1.0000 | ORAL_TABLET | Freq: Every day | ORAL | 2 refills | Status: DC

## 2021-07-22 NOTE — Progress Notes (Signed)
I,[name],acting as a Education administrator for [employer], FNP.,have documented all relevant documentation on the behalf of [name], FNP,as directed by  [name], FNP while in the presence of [name], Benjamin Perez.  This visit occurred during the SARS-CoV-2 public health emergency.  Safety protocols were in place, including screening questions prior to the visit, additional usage of staff PPE, and extensive cleaning of exam room while observing appropriate contact time as indicated for disinfecting solutions.  Subjective:     Patient ID: Kevin Mckinney , male    DOB: 1956-01-26 , 66 y.o.   MRN: [id number]   Chief Complaint  Patient presents with   Establish Care    HPI  Patient is here to establish care.  He had been seen at [facility] several years ago. Single. He does have children. He is here today to establish care but he also had been seen in the ED in August and his CT scan of abdomen showed an abnormal finding. He is concerned about the results. He was diagnosed with diverticulitis last year but is doing okay right now. I am unsure if he is established with a GI provider at this time.    He is dealing with some depression related to his finances. Her verbalizes a long history of depression but has not taken any medications or sought counseling. He has also been taking metamucil and has hemorroids. He admits he does not cook due to being on the go. His son lives with him and his fiance lives there.   PMH - mother - deceased 8 years ago, heart problems. Father - mouth/facial (deceased), brother - deceased (possible colon cancer), sister deceased - 3 years (cancer), sister (older than him) - COPD, brother - COPD, youngest sister (functional alcoholic).   He feels he has dealt with depression due to bullying. He felt like he was struggling with buying. He was incarcerated for 10 years and thinking he was depressed. He has never been to counseling. He has never taken any  medications for depression. 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   Reports had a colonoscopy initially in 1994, then had one more then when he was incarcerated he had a fissure tear in his intestine.      Past Medical History:  Diagnosis Date   Diverticulitis    Hemorrhoids      History reviewed. No pertinent family history.   Current Outpatient Medications:    Magnesium 200 MG TABS, Take 1 tablet (200 mg total) by mouth daily. Take with evening meal, Disp: 30 tablet, Rfl: 2   acetaminophen (TYLENOL) 500 MG tablet, Take 2 tablets (1,000 mg total) by mouth every 6 (six) hours as needed for mild pain, fever or headache., Disp: 30 tablet, Rfl: 0   HYDROcodone-acetaminophen (NORCO/VICODIN) 5-325 MG tablet, Take 2 tablets by mouth every 4 (four) hours as needed., Disp: 4 tablet, Rfl: 0   ibuprofen (ADVIL) 200 MG tablet, Take 400 mg by mouth at bedtime as needed for moderate pain., Disp: , Rfl:    ondansetron (ZOFRAN ODT) 4 MG disintegrating tablet, Take 1 tablet (4 mg total) by mouth every 8 (eight) hours as needed for nausea or vomiting., Disp: 8 tablet, Rfl: 0   psyllium (METAMUCIL) 58.6 % powder, Take 1 packet by mouth daily as needed (constipation)., Disp: , Rfl:    No Known Allergies   Review of Systems  Constitutional: Negative.   HENT: Negative.    Eyes: Negative.   Respiratory: Negative.  Cardiovascular: Negative.   Gastrointestinal: Negative.   Endocrine: Negative.   Genitourinary:        Erectile dysfunction  Musculoskeletal: Negative.   Skin:        Discolored toenail beds  Neurological:  Positive for headaches (tightness).  Psychiatric/Behavioral:  Negative for suicidal ideas.        Reports depression    Today's Vitals   07/22/21 1130  BP: 134/68  Pulse: 84  Temp: 98.2 F (36.8 C)  TempSrc: Oral  Weight: 218 lb (98.9 kg)  Height: 6' 8.8" (2.052 m)   Body mass index is 23.48 kg/m.   Objective:  Physical Exam Vitals reviewed.  Constitutional:       General: He is not in acute distress.    Appearance: Normal appearance.  HENT:     Head: Normocephalic.  Cardiovascular:     Rate and Rhythm: Normal rate and regular rhythm.     Pulses: Normal pulses.     Heart sounds: Normal heart sounds. No murmur heard. Pulmonary:     Effort: Pulmonary effort is normal. No respiratory distress.     Breath sounds: Normal breath sounds. No wheezing.  Abdominal:     General: Abdomen is flat. Bowel sounds are normal. There is no distension.     Palpations: Abdomen is soft. There is no mass.     Tenderness: There is no abdominal tenderness. There is no guarding.  Skin:    General: Skin is warm.     Capillary Refill: Capillary refill takes less than 2 seconds.  Neurological:     General: No focal deficit present.     Mental Status: He is alert and oriented to person, place, and time.     Cranial Nerves: No cranial nerve deficit.     Motor: No weakness.  Psychiatric:        Attention and Perception: Attention and perception normal.        Mood and Affect: Mood is anxious.        Speech: Speech normal.        Behavior: Behavior normal.        Thought Content: Thought content normal.        Cognition and Memory: Cognition normal.        Judgment: Judgment normal.     Comments: Seems anxious when talking about his health concerns        Assessment And Plan:     1. Abnormal abdominal CT scan Comments: Incidental finding of Hypoenhancement of the pancreatic head/uncinate process relative to the remainder of the parenchyma from a CT abdomen/pelvis.  - MR Abdomen W Wo Contrast; Future  2. Diverticulitis Comments: Has had 2 flares in the last year with hospitalization currently stable.   3. Tension Comments: Describes tension headache with tightness around his head and the back of the neck. At this time focus on decreasing stress.   4. Nail discoloration Comments: Would like referral to podiatry to evaluate further - Ambulatory referral to  Podiatry  5. Other depression Comments: Depression screen score 12. Would like to try counseling first before medications. Encouragd to take Magnesium supplement may also help with HA.  - TSH - Magnesium 200 MG TABS; Take 1 tablet (200 mg total) by mouth daily. Take with evening meal  Dispense: 30 tablet; Refill: 2 - Ambulatory referral to Psychology  6. Other male erectile dysfunction Comments: Will check metabolic labs to see if there is a cause.  - Testosterone, Total - PSA  7.  Nocturia Comments: Will check PSA, this may be a sign of BPH.  - Testosterone, Total - PSA  8. Encounter for screening for metabolic disorder - Lipid panel - Hemoglobin A1c - CMP14+EGFR - CBC  9. Encounter for hepatitis C screening test for low risk patient Will check Hepatitis C screening due to recent recommendations to screen all adults 18 years and older - Hepatitis C antibody  10. Influenza vaccination declined Patient declined influenza vaccination at this time. Patient is aware that influenza vaccine prevents illness in 70% of healthy people, and reduces hospitalizations to 30-70% in elderly. This vaccine is recommended annually. Pt is willing to accept risk associated with refusing vaccination.  11. COVID-19 vaccination declined Declines covid 19 vaccine. Discussed risk of covid 42 and if he changes her mind about the vaccine to call the office.  Encouraged to take multivitamin, vitamin d, vitamin c and zinc to increase immune system. Aware can call office if would like to have vaccine here at office.    12. Encounter for screening colonoscopy According to USPTF Colorectal cancer Screening guidelines. Colonoscopy is recommended every 10 years, starting at age 22 years. Will refer to GI for colon cancer screening. - Ambulatory referral to Gastroenterology  13. Encounter for medical examination to establish care     Patient was given opportunity to ask questions. Patient verbalized  understanding of the plan and was able to repeat key elements of the plan. All questions were answered to their satisfaction.  [name], FNP   I, [name], FNP, have reviewed all documentation for this visit. The documentation on 07/23/21 for the exam, diagnosis, procedures, and orders are all accurate and complete.   IF YOU HAVE BEEN REFERRED TO A SPECIALIST, IT MAY TAKE 1-2 WEEKS TO SCHEDULE/PROCESS THE REFERRAL. IF YOU HAVE NOT HEARD FROM US/SPECIALIST IN TWO WEEKS, PLEASE GIVE [place] A CALL AT [phone].   THE PATIENT IS ENCOURAGED TO PRACTICE SOCIAL DISTANCING DUE TO THE COVID-19 PANDEMIC.

## 2021-07-22 NOTE — Patient Instructions (Signed)
Diverticulitis Diverticulitis is when small pouches in your colon (large intestine) get infected or swollen. This causes pain in the belly (abdomen) and watery poop (diarrhea). These pouches are called diverticula. The pouches form in people who have a condition called diverticulosis. What are the causes? This condition may be caused by poop (stool) that gets trapped in the pouches in your colon. The poop lets germs (bacteria) grow in the pouches. This causes the infection. What increases the risk? You are more likely to get this condition if you have small pouches in your colon. The risk is higher if: You are overweight or very overweight (obese). You do not exercise enough. You drink alcohol. You smoke or use products with tobacco in them. You eat a diet that has a lot of red meat such as beef, pork, or lamb. You eat a diet that does not have enough fiber in it. You are older than 67 years of age. What are the signs or symptoms? Pain in the belly. Pain is often on the left side, but it may be in other areas. Fever and feeling cold. Feeling like you may vomit. Vomiting. Having cramps. Feeling full. Changes to how often you poop. Blood in your poop. How is this treated? Most cases are treated at home by: Taking over-the-counter pain medicines. Following a clear liquid diet. Taking antibiotic medicines. Resting. Very bad cases may need to be treated at a hospital. This may include: Not eating or drinking. Taking prescription pain medicine. Getting antibiotic medicines through an IV tube. Getting fluid and food through an IV tube. Having surgery. When you are feeling better, your doctor may tell you to have a test to check your colon (colonoscopy). Follow these instructions at home: Medicines Take over-the-counter and prescription medicines only as told by your doctor. These include: Antibiotics. Pain medicines. Fiber pills. Probiotics. Stool softeners. If you were  prescribed an antibiotic medicine, take it as told by your doctor. Do not stop taking the antibiotic even if you start to feel better. Ask your doctor if the medicine prescribed to you requires you to avoid driving or using machinery. Eating and drinking  Follow a diet as told by your doctor. When you feel better, your doctor may tell you to change your diet. You may need to eat a lot of fiber. Fiber makes it easier to poop (have a bowel movement). Foods with fiber include: Berries. Beans. Lentils. Green vegetables. Avoid eating red meat. General instructions Do not use any products that contain nicotine or tobacco, such as cigarettes, e-cigarettes, and chewing tobacco. If you need help quitting, ask your doctor. Exercise 3 or more times a week. Try to get 30 minutes each time. Exercise enough to sweat and make your heart beat faster. Keep all follow-up visits as told by your doctor. This is important. Contact a doctor if: Your pain does not get better. You are not pooping like normal. Get help right away if: Your pain gets worse. Your symptoms do not get better. Your symptoms get worse very fast. You have a fever. You vomit more than one time. You have poop that is: Bloody. Black. Tarry. Summary This condition happens when small pouches in your colon get infected or swollen. Take medicines only as told by your doctor. Follow a diet as told by your doctor. Keep all follow-up visits as told by your doctor. This is important. This information is not intended to replace advice given to you by your health care provider. Make sure you  discuss any questions you have with your health care provider. Document Revised: 03/18/2019 Document Reviewed: 03/18/2019 Elsevier Patient Education  2022 [employer].

## 2021-07-23 LAB — CMP14+EGFR
ALT: 17 IU/L (ref 0–44)
AST: 21 IU/L (ref 0–40)
Albumin/Globulin Ratio: 1.5 (ref 1.2–2.2)
Albumin: 4.2 g/dL (ref 3.8–4.8)
Alkaline Phosphatase: 89 IU/L (ref 44–121)
BUN/Creatinine Ratio: 10 (ref 10–24)
BUN: 13 mg/dL (ref 8–27)
Bilirubin Total: 0.4 mg/dL (ref 0.0–1.2)
CO2: 25 mmol/L (ref 20–29)
Calcium: 9.1 mg/dL (ref 8.6–10.2)
Chloride: 106 mmol/L (ref 96–106)
Creatinine, Ser: 1.27 mg/dL (ref 0.76–1.27)
Globulin, Total: 2.8 g/dL (ref 1.5–4.5)
Glucose: 89 mg/dL (ref 70–99)
Potassium: 4.4 mmol/L (ref 3.5–5.2)
Sodium: 143 mmol/L (ref 134–144)
Total Protein: 7 g/dL (ref 6.0–8.5)
eGFR: 62 mL/min/{1.73_m2} (ref >59)

## 2021-07-23 LAB — PSA: Prostate Specific Ag, Serum: 1.6 ng/mL (ref 0.0–4.0)

## 2021-07-23 LAB — LIPID PANEL
Chol/HDL Ratio: 3.4 ratio (ref 0.0–5.0)
Cholesterol, Total: 159 mg/dL (ref 100–199)
HDL: 47 mg/dL (ref >39)
LDL Chol Calc (NIH): 94 mg/dL (ref 0–99)
Triglycerides: 95 mg/dL (ref 0–149)
VLDL Cholesterol Cal: 18 mg/dL (ref 5–40)

## 2021-07-23 LAB — TESTOSTERONE: Testosterone: 471 ng/dL (ref 264–916)

## 2021-07-23 LAB — CBC
Hematocrit: 42.1 % (ref 37.5–51.0)
Hemoglobin: 13.9 g/dL (ref 13.0–17.7)
MCH: 28.4 pg (ref 26.6–33.0)
MCHC: 33 g/dL (ref 31.5–35.7)
MCV: 86 fL (ref 79–97)
Platelets: 216 10*3/uL (ref 150–450)
RBC: 4.89 x10E6/uL (ref 4.14–5.80)
RDW: 12.5 % (ref 11.6–15.4)
WBC: 5.9 10*3/uL (ref 3.4–10.8)

## 2021-07-23 LAB — HEMOGLOBIN A1C
Est. average glucose Bld gHb Est-mCnc: 117 mg/dL
Hgb A1c MFr Bld: 5.7 % — ABNORMAL HIGH (ref 4.8–5.6)

## 2021-07-23 LAB — TSH: TSH: 2.24 u[IU]/mL (ref 0.450–4.500)

## 2021-07-23 LAB — HEPATITIS C ANTIBODY: Hep C Virus Ab: <0.1 s/co ratio (ref 0.0–0.9)

## 2021-08-02 ENCOUNTER — Ambulatory Visit: Payer: Medicare Other | Admitting: Podiatry

## 2021-08-03 ENCOUNTER — Encounter: Payer: Self-pay | Admitting: Podiatry

## 2021-08-03 ENCOUNTER — Ambulatory Visit (INDEPENDENT_AMBULATORY_CARE_PROVIDER_SITE_OTHER): Payer: Medicare Other | Admitting: Podiatry

## 2021-08-03 ENCOUNTER — Other Ambulatory Visit: Payer: Self-pay

## 2021-08-03 DIAGNOSIS — B351 Tinea unguium: Secondary | ICD-10-CM

## 2021-08-03 MED ORDER — TERBINAFINE HCL 250 MG PO TABS: 250.0000 mg | ORAL_TABLET | Freq: Every day | ORAL | 0 refills | Status: AC

## 2021-08-08 ENCOUNTER — Encounter: Payer: Self-pay | Admitting: Podiatry

## 2021-08-08 NOTE — Progress Notes (Signed)
°  Subjective:  Patient ID: Kevin Mckinney, male    DOB: June 16, 1956,  MRN: [id number]  Chief Complaint  Patient presents with   Nail Problem    Nail discoloration    66 y.o. male presents with the above complaint. History confirmed with patient.  Nails are thickened elongated and discolored.  Objective:  Physical Exam: warm, good capillary refill, no trophic changes or ulcerative lesions, normal DP and PT pulses, normal sensory exam, and onychomycosis.      Assessment:   1. Onychomycosis      Plan:  Patient was evaluated and treated and all questions answered.  Discussed etiology and treatment options of onychomycosis in detail with the patient.  Discussed oral topical and laser treatment.  I recommended oral treatment with Lamisil and a 90-day course was sent to his pharmacy.  Last LFTs completed 07/22/2021 and were in normal limits Return in about 14 weeks (around 11/09/2021) for follow up after nail fungus treatment.

## 2021-08-11 ENCOUNTER — Other Ambulatory Visit: Payer: Medicare Other

## 2021-08-17 ENCOUNTER — Other Ambulatory Visit: Payer: Self-pay

## 2021-08-17 ENCOUNTER — Ambulatory Visit
Admission: RE | Admit: 2021-08-17 | Discharge: 2021-08-17 | Disposition: A | Discharge status: Home / Self Care | Payer: Medicare Other | Source: Ambulatory Visit | Attending: Nurse Practitioner | Admitting: Nurse Practitioner

## 2021-08-17 ENCOUNTER — Telehealth: Payer: Self-pay | Admitting: Nurse Practitioner

## 2021-08-17 DIAGNOSIS — R935 Abnormal findings on diagnostic imaging of other abdominal regions, including retroperitoneum: Secondary | ICD-10-CM

## 2021-08-17 MED ORDER — GADOBENATE DIMEGLUMINE 529 MG/ML IV SOLN
19.0000 mL | Freq: Once | INTRAVENOUS | Status: AC | PRN
  Administered 2021-08-17: 19 mL via INTRAVENOUS

## 2021-08-17 NOTE — Telephone Encounter (Signed)
Left message for patient to call back and schedule Medicare Annual Wellness Visit (AWV) either virtually or in office.  Left both my jabber number [phone] and office number   awvi 05/20/21 per [place]   please schedule at anytime with [facility]    This should be a 45 minute visit.

## 2021-08-24 ENCOUNTER — Telehealth: Payer: Self-pay | Admitting: Nurse Practitioner

## 2021-08-24 NOTE — Telephone Encounter (Signed)
Tried calling patient to schedule Medicare Annual Wellness Visit (AWV) either virtually or in office.   ? ?No answer  ? ?  awvi 05/20/21 per [place]  please schedule at anytime with [facility]  ? ? ?This should be a 45 minute visit.  ?

## 2021-09-08 ENCOUNTER — Telehealth: Payer: Self-pay | Admitting: Nurse Practitioner

## 2021-09-08 NOTE — Telephone Encounter (Signed)
Left message for patient to call back and schedule Medicare Annual Wellness Visit (AWV) either virtually or in office.  Left both my jabber number [phone] and office number  ? ? ?  awvi 05/20/21 per [place]  ? please schedule at anytime with [facility]  ? ? ?This should be a 45 minute visit.  ?

## 2021-09-13 ENCOUNTER — Telehealth: Payer: Self-pay | Admitting: Nurse Practitioner

## 2021-09-13 NOTE — Telephone Encounter (Signed)
Tried calling patient to schedule Medicare Annual Wellness Visit (AWV) either virtually or in office.  ? ?No answer  ? ? ?awvi 05/20/21 per [place]  ?please schedule at anytime with [facility]  ? ? ?This should be a 45 minute visit.  ?

## 2021-10-13 ENCOUNTER — Telehealth: Payer: Self-pay | Admitting: Nurse Practitioner

## 2021-10-13 NOTE — Telephone Encounter (Signed)
Left message for patient to call back to schedule Medicare Annual Wellness Visit  ? ?No hx of AWV eligible as of 06/21/20 ? ? ? ? ?Any questions, please call me at [phone]  ?

## 2021-10-20 ENCOUNTER — Encounter: Payer: Self-pay | Admitting: Nurse Practitioner

## 2021-10-20 ENCOUNTER — Ambulatory Visit (INDEPENDENT_AMBULATORY_CARE_PROVIDER_SITE_OTHER): Payer: Medicare Other | Admitting: Nurse Practitioner

## 2021-10-20 VITALS — BP 122/66 | HR 64 | Temp 98.5°F | Ht >= 80 in | Wt 209.0 lb

## 2021-10-20 DIAGNOSIS — N529 Male erectile dysfunction, unspecified: Secondary | ICD-10-CM

## 2021-10-20 DIAGNOSIS — M79641 Pain in right hand: Secondary | ICD-10-CM

## 2021-10-20 DIAGNOSIS — R110 Nausea: Secondary | ICD-10-CM

## 2021-10-20 DIAGNOSIS — Z125 Encounter for screening for malignant neoplasm of prostate: Secondary | ICD-10-CM

## 2021-10-20 DIAGNOSIS — F3289 Other specified depressive episodes: Secondary | ICD-10-CM

## 2021-10-20 DIAGNOSIS — Z0000 Encounter for general adult medical examination without abnormal findings: Secondary | ICD-10-CM

## 2021-10-20 DIAGNOSIS — Z23 Encounter for immunization: Secondary | ICD-10-CM

## 2021-10-20 DIAGNOSIS — K649 Unspecified hemorrhoids: Secondary | ICD-10-CM

## 2021-10-20 MED ORDER — SILDENAFIL CITRATE 100 MG PO TABS: 50.0000 mg | ORAL_TABLET | Freq: Every day | ORAL | 5 refills | Status: DC | PRN

## 2021-10-20 MED ORDER — ONDANSETRON 4 MG PO TBDP: 4.0000 mg | ORAL_TABLET | Freq: Three times a day (TID) | ORAL | 0 refills | Status: DC | PRN

## 2021-10-20 MED ORDER — SILDENAFIL CITRATE 50 MG PO TABS: 50.0000 mg | ORAL_TABLET | Freq: Every day | ORAL | 0 refills | Status: DC | PRN

## 2021-10-20 NOTE — Progress Notes (Signed)
I,Kevin Mckinney,acting as a Education administrator for [employer], FNP.,have documented all relevant documentation on the behalf of Kevin Brine, FNP,as directed by  Kevin Brine, FNP while in the presence of Kevin Mckinney, Kevin Mckinney.  This visit occurred during the SARS-CoV-2 public health emergency.  Safety protocols were in place, including screening questions prior to the visit, additional usage of staff PPE, and extensive cleaning of exam room while observing appropriate contact time as indicated for disinfecting solutions.  Subjective:     Patient ID: Kevin Mckinney , male    DOB: [birth year] , 66 y.o.   MRN: [id number]   Chief Complaint  Patient presents with   Annual Exam    HPI  Patients presents for HM.  He feels like his stress level has caused him to lose weight. He did not go to get the terbinafine.   He has been having more financial challenges.     Past Medical History:  Diagnosis Date   Diverticulitis    Hemorrhoids      History reviewed. No pertinent family history.   Current Outpatient Medications:    acetaminophen (TYLENOL) 500 MG tablet, Take 2 tablets (1,000 mg total) by mouth every 6 (six) hours as needed for mild pain, fever or headache., Disp: 30 tablet, Rfl: 0   HYDROcodone-acetaminophen (NORCO/VICODIN) 5-325 MG tablet, Take 2 tablets by mouth every 4 (four) hours as needed., Disp: 4 tablet, Rfl: 0   ibuprofen (ADVIL) 200 MG tablet, Take 400 mg by mouth at bedtime as needed for moderate pain., Disp: , Rfl:    Magnesium 200 MG TABS, Take 1 tablet (200 mg total) by mouth daily. Take with evening meal, Disp: 30 tablet, Rfl: 2   psyllium (METAMUCIL) 58.6 % powder, Take 1 packet by mouth daily as needed (constipation)., Disp: , Rfl:    terbinafine (LAMISIL) 250 MG tablet, Take 1 tablet (250 mg total) by mouth daily., Disp: 90 tablet, Rfl: 0   ondansetron (ZOFRAN ODT) 4 MG disintegrating tablet, Take 1 tablet (4 mg total) by mouth every 8 (eight) hours as needed for nausea or  vomiting., Disp: 30 tablet, Rfl: 0   sildenafil (VIAGRA) 100 MG tablet, Take 0.5 tablets (50 mg total) by mouth daily as needed for erectile dysfunction., Disp: 20 tablet, Rfl: 5   No Known Allergies   Men's preventive visit. Patient Health Questionnaire (PHQ-2) is  [place] Office Visit from 07/22/2021 in [facility]  PHQ-2 Total Score 3     Patient is on a Regular diet. Exercise - does not do any routine exercise but works a vigorous job. Marital status: Single. Relevant history for alcohol use is:  Social History   Substance and Sexual Activity  Alcohol Use No   Relevant history for tobacco use is:  Social History   Tobacco Use  Smoking Status Never  Smokeless Tobacco Never  .   Review of Systems  Constitutional: Negative.   HENT: Negative.    Eyes: Negative.   Respiratory: Negative.    Cardiovascular: Negative.   Gastrointestinal: Negative.   Endocrine: Negative.   Genitourinary: Negative.        He feels he has ED. He has taken siladenafil.   Musculoskeletal: Negative.   Skin: Negative.   Allergic/Immunologic: Negative.   Neurological: Negative.   Hematological: Negative.   Psychiatric/Behavioral: Negative.      Today's Vitals   10/20/21 1149  BP: 122/66  Pulse: 64  Temp: 98.5 F (36.9 C)  TempSrc: Oral  Weight: 209 lb (  94.8 kg)  Height: 6' 8.8" (2.052 m)   Body mass index is 22.51 kg/m.  Wt Readings from Last 3 Encounters:  10/20/21 209 lb (94.8 kg)  07/22/21 218 lb (98.9 kg)  02/02/21 222 lb (100.7 kg)    Objective:  Physical Exam Vitals reviewed.  Constitutional:      General: He is not in acute distress.    Appearance: Normal appearance. He is obese.  HENT:     Head: Normocephalic and atraumatic.     Right Ear: Tympanic membrane, ear canal and external ear normal. There is no impacted cerumen.     Left Ear: Tympanic membrane, ear canal and external ear normal. There is no impacted cerumen.  Cardiovascular:      Rate and Rhythm: Normal rate and regular rhythm.     Pulses: Normal pulses.     Heart sounds: Normal heart sounds. No murmur heard. Pulmonary:     Effort: Pulmonary effort is normal. No respiratory distress.     Breath sounds: Normal breath sounds.  Abdominal:     General: Abdomen is flat. Bowel sounds are normal. There is no distension.     Palpations: Abdomen is soft.  Genitourinary:    Prostate: Normal.     Rectum: Guaiac result negative.  Musculoskeletal:        General: Normal range of motion.     Cervical back: Normal range of motion and neck supple.  Skin:    General: Skin is warm.     Capillary Refill: Capillary refill takes less than 2 seconds.  Neurological:     General: No focal deficit present.     Mental Status: He is alert and oriented to person, place, and time.     Cranial Nerves: No cranial nerve deficit.     Motor: No weakness.  Psychiatric:        Mood and Affect: Mood normal.        Behavior: Behavior normal.        Thought Content: Thought content normal.        Judgment: Judgment normal.        Assessment And Plan:    1. Encounter for annual physical exam Behavior modifications discussed and diet history reviewed.   Pt will continue to exercise regularly and modify diet with low GI, plant based foods and decrease intake of processed foods.  Recommend intake of daily multivitamin, Vitamin D, and calcium.  Recommend colonoscopy (phone number given for GI) for preventive screenings, as well as recommend immunizations that include influenza, TDAP, and Shingles (declined) - CBC - Hemoglobin A1c - CMP14+EGFR - Lipid panel  2. Encounter for prostate cancer screening - PSA  3. Encounter for immunization Will give tetanus vaccine today while in office. Refer to order management. TDAP will be administered to adults 11-24 years old every 10 years. - Tdap vaccine greater than or equal to 7yo IM  4. Other depression Comments: I have given him the number to  the psychologist   5. Erectile dysfunction, unspecified erectile dysfunction type - sildenafil (VIAGRA) 100 MG tablet; Take 0.5 tablets (50 mg total) by mouth daily as needed for erectile dysfunction.  Dispense: 20 tablet; Refill: 5  6. Right hand pain Comments: He is having difficulty with holding on to some objects, will refer to hand surgery  - Ambulatory referral to Hand Surgery  7. Nausea Comments: Encouraged to limit intake of fatty foods. Also to drink adequate amounts of water and be sure to have regular bowel  movements.  - ondansetron (ZOFRAN ODT) 4 MG disintegrating tablet; Take 1 tablet (4 mg total) by mouth every 8 (eight) hours as needed for nausea or vomiting.  Dispense: 30 tablet; Refill: 0  8. Hemorrhoids, unspecified hemorrhoid type Comments: Make sure has bowel movement regularly and avoid straining     Patient was given opportunity to ask questions. Patient verbalized understanding of the plan and was able to repeat key elements of the plan. All questions were answered to their satisfaction.   Kevin Brine, FNP   I, Kevin Brine, FNP, have reviewed all documentation for this visit. The documentation on 10/20/21 for the exam, diagnosis, procedures, and orders are all accurate and complete.   THE PATIENT IS ENCOURAGED TO PRACTICE SOCIAL DISTANCING DUE TO THE COVID-19 PANDEMIC.

## 2021-10-20 NOTE — Patient Instructions (Addendum)
Health Maintenance, Male ?Adopting a healthy lifestyle and getting preventive care are important in promoting health and wellness. Ask your health care provider about: ?The right schedule for you to have regular tests and exams. ?Things you can do on your own to prevent diseases and keep yourself healthy. ?What should I know about diet, weight, and exercise? ?Eat a healthy diet ? ?Eat a diet that includes plenty of vegetables, fruits, low-fat dairy products, and lean protein. ?Do not eat a lot of foods that are high in solid fats, added sugars, or sodium. ?Maintain a healthy weight ?Body mass index (BMI) is a measurement that can be used to identify possible weight problems. It estimates body fat based on height and weight. Your health care provider can help determine your BMI and help you achieve or maintain a healthy weight. ?Get regular exercise ?Get regular exercise. This is one of the most important things you can do for your health. Most adults should: ?Exercise for at least 150 minutes each week. The exercise should increase your heart rate and make you sweat (moderate-intensity exercise). ?Do strengthening exercises at least twice a week. This is in addition to the moderate-intensity exercise. ?Spend less time sitting. Even light physical activity can be beneficial. ?Watch cholesterol and blood lipids ?Have your blood tested for lipids and cholesterol at 67 years of age, then have this test every 5 years. ?You may need to have your cholesterol levels checked more often if: ?Your lipid or cholesterol levels are high. ?You are older than 67 years of age. ?You are at high risk for heart disease. ?What should I know about cancer screening? ?Many types of cancers can be detected early and may often be prevented. Depending on your health history and family history, you may need to have cancer screening at various ages. This may include screening for: ?Colorectal cancer. ?Prostate cancer. ?Skin cancer. ?Lung  cancer. ?What should I know about heart disease, diabetes, and high blood pressure? ?Blood pressure and heart disease ?High blood pressure causes heart disease and increases the risk of stroke. This is more likely to develop in people who have high blood pressure readings or are overweight. ?Talk with your health care provider about your target blood pressure readings. ?Have your blood pressure checked: ?Every 3-5 years if you are 42-43 years of age. ?Every year if you are 47 years old or older. ?If you are between the ages of 11 and 57 and are a current or former smoker, ask your health care provider if you should have a one-time screening for abdominal aortic aneurysm (AAA). ?Diabetes ?Have regular diabetes screenings. This checks your fasting blood sugar level. Have the screening done: ?Once every three years after age 8 if you are at a normal weight and have a low risk for diabetes. ?More often and at a younger age if you are overweight or have a high risk for diabetes. ?What should I know about preventing infection? ?Hepatitis B ?If you have a higher risk for hepatitis B, you should be screened for this virus. Talk with your health care provider to find out if you are at risk for hepatitis B infection. ?Hepatitis C ?Blood testing is recommended for: ?Everyone born from [birth year] through 1966. ?Anyone with known risk factors for hepatitis C. ?Sexually transmitted infections (STIs) ?You should be screened each year for STIs, including gonorrhea and chlamydia, if: ?You are sexually active and are younger than 67 years of age. ?You are older than 67 years of age and your  health care provider tells you that you are at risk for this type of infection. ?Your sexual activity has changed since you were last screened, and you are at increased risk for chlamydia or gonorrhea. Ask your health care provider if you are at risk. ?Ask your health care provider about whether you are at high risk for HIV. Your health care provider  may recommend a prescription medicine to help prevent HIV infection. If you choose to take medicine to prevent HIV, you should first get tested for HIV. You should then be tested every 3 months for as long as you are taking the medicine. ?Follow these instructions at home: ?Alcohol use ?Do not drink alcohol if your health care provider tells you not to drink. ?If you drink alcohol: ?Limit how much you have to 0-2 drinks a day. ?Know how much alcohol is in your drink. In the [place] one drink equals one 12 oz bottle of beer (355 mL), one 5 oz glass of wine (148 mL), or one 1? oz glass of hard liquor (44 mL). ?Lifestyle ?Do not use any products that contain nicotine or tobacco. These products include cigarettes, chewing tobacco, and vaping devices, such as e-cigarettes. If you need help quitting, ask your health care provider. ?Do not use street drugs. ?Do not share needles. ?Ask your health care provider for help if you need support or information about quitting drugs. ?General instructions ?Schedule regular health, dental, and eye exams. ?Stay current with your vaccines. ?Tell your health care provider if: ?You often feel depressed. ?You have ever been abused or do not feel safe at home. ?Summary ?Adopting a healthy lifestyle and getting preventive care are important in promoting health and wellness. ?Follow your health care provider's instructions about healthy diet, exercising, and getting tested or screened for diseases. ?Follow your health care provider's instructions on monitoring your cholesterol and blood pressure. ?This information is not intended to replace advice given to you by your health care provider. Make sure you discuss any questions you have with your health care provider. ?Document Revised: 10/26/2020 Document Reviewed: 10/26/2020 ?Elsevier Patient Education ? [place]. ? ?[place] ?[place],  [address]. ? ?Gastroenterology ?DR Collene Mares   [phone] ? ?

## 2021-10-21 LAB — CMP14+EGFR
ALT: 19 IU/L (ref 0–44)
AST: 22 IU/L (ref 0–40)
Albumin/Globulin Ratio: 1.6 (ref 1.2–2.2)
Albumin: 4.4 g/dL (ref 3.8–4.8)
Alkaline Phosphatase: 91 IU/L (ref 44–121)
BUN/Creatinine Ratio: 13 (ref 10–24)
BUN: 16 mg/dL (ref 8–27)
Bilirubin Total: 0.8 mg/dL (ref 0.0–1.2)
CO2: 23 mmol/L (ref 20–29)
Calcium: 9.4 mg/dL (ref 8.6–10.2)
Chloride: 104 mmol/L (ref 96–106)
Creatinine, Ser: 1.21 mg/dL (ref 0.76–1.27)
Globulin, Total: 2.7 g/dL (ref 1.5–4.5)
Glucose: 93 mg/dL (ref 70–99)
Potassium: 4.4 mmol/L (ref 3.5–5.2)
Sodium: 141 mmol/L (ref 134–144)
Total Protein: 7.1 g/dL (ref 6.0–8.5)
eGFR: 66 mL/min/{1.73_m2} (ref >59)

## 2021-10-21 LAB — CBC
Hematocrit: 43 % (ref 37.5–51.0)
Hemoglobin: 14.2 g/dL (ref 13.0–17.7)
MCH: 29 pg (ref 26.6–33.0)
MCHC: 33 g/dL (ref 31.5–35.7)
MCV: 88 fL (ref 79–97)
Platelets: 224 10*3/uL (ref 150–450)
RBC: 4.89 x10E6/uL (ref 4.14–5.80)
RDW: 12.9 % (ref 11.6–15.4)
WBC: 9.9 10*3/uL (ref 3.4–10.8)

## 2021-10-21 LAB — LIPID PANEL
Chol/HDL Ratio: 3.5 ratio (ref 0.0–5.0)
Cholesterol, Total: 164 mg/dL (ref 100–199)
HDL: 47 mg/dL (ref >39)
LDL Chol Calc (NIH): 102 mg/dL — ABNORMAL HIGH (ref 0–99)
Triglycerides: 76 mg/dL (ref 0–149)
VLDL Cholesterol Cal: 15 mg/dL (ref 5–40)

## 2021-10-21 LAB — HEMOGLOBIN A1C
Est. average glucose Bld gHb Est-mCnc: 114 mg/dL
Hgb A1c MFr Bld: 5.6 % (ref 4.8–5.6)

## 2021-10-21 LAB — PSA: Prostate Specific Ag, Serum: 1.8 ng/mL (ref 0.0–4.0)

## 2021-10-26 ENCOUNTER — Other Ambulatory Visit: Payer: Self-pay

## 2021-10-26 ENCOUNTER — Telehealth: Payer: Self-pay | Admitting: Nurse Practitioner

## 2021-10-26 NOTE — Telephone Encounter (Signed)
Tried calling patient to schedule Medicare Annual Wellness Visit (AWV) either virtually or in office. ? ?No answer ? ?Last AWV ;  awvi 05/20/21 per [facility]  ? ? ?This should be a 45 minute visit.  ?

## 2021-11-09 ENCOUNTER — Ambulatory Visit (INDEPENDENT_AMBULATORY_CARE_PROVIDER_SITE_OTHER): Payer: Self-pay | Admitting: Podiatry

## 2021-11-09 DIAGNOSIS — Z91199 Patient's noncompliance with other medical treatment and regimen due to unspecified reason: Secondary | ICD-10-CM

## 2021-11-10 NOTE — Progress Notes (Signed)
Patient was no-show for appointment today 

## 2021-11-17 ENCOUNTER — Ambulatory Visit (INDEPENDENT_AMBULATORY_CARE_PROVIDER_SITE_OTHER): Payer: Medicare Other

## 2021-11-17 ENCOUNTER — Encounter: Payer: Self-pay | Admitting: Gastroenterology

## 2021-11-17 VITALS — Ht 72.0 in | Wt 212.0 lb

## 2021-11-17 DIAGNOSIS — Z0000 Encounter for general adult medical examination without abnormal findings: Secondary | ICD-10-CM | POA: Diagnosis not present

## 2021-11-17 DIAGNOSIS — Z1211 Encounter for screening for malignant neoplasm of colon: Secondary | ICD-10-CM

## 2021-11-17 NOTE — Progress Notes (Signed)
I connected with Kevin Mckinney today by telephone and verified that I am speaking with the correct person using two identifiers. Location patient: home Location provider: work Persons participating in the virtual visit: Kevin Mckinney, Glenna Durand LPN.   I discussed the limitations, risks, security and privacy concerns of performing an evaluation and management service by telephone and the availability of in person appointments. I also discussed with the patient that there may be a patient responsible charge related to this service. The patient expressed understanding and verbally consented to this telephonic visit.    Interactive audio and video telecommunications were attempted between this provider and patient, however failed, due to patient having technical difficulties OR patient did not have access to video capability.  We continued and completed visit with audio only.     Vital signs may be patient reported or missing.  Subjective:   Kevin Mckinney is a 66 y.o. male who presents for an Initial Medicare Annual Wellness Visit.  Review of Systems     Cardiac Risk Factors include: advanced age (>60mn, >>40women)     Objective:    Today's Vitals   11/17/21 1210  Weight: 212 lb (96.2 kg)  Height: 6' (1.829 m)   Body mass index is 28.75 kg/m.     11/17/2021   12:16 PM 01/09/2021    2:00 PM 11/20/2018    2:36 PM 07/17/2017    8:04 AM 03/12/2015    3:16 PM  Advanced Directives  Does Patient Have a Medical Advance Directive? No No No No No  Would patient like information on creating a medical advance directive?  Yes (Inpatient - patient requests chaplain consult to create a medical advance directive)  No - Patient declined No - patient declined information    Current Medications (verified) Outpatient Encounter Medications as of 11/17/2021  Medication Sig   acetaminophen (TYLENOL) 500 MG tablet Take 2 tablets (1,000 mg total) by mouth every 6 (six) hours as needed for mild  pain, fever or headache.   ibuprofen (ADVIL) 200 MG tablet Take 400 mg by mouth at bedtime as needed for moderate pain.   psyllium (METAMUCIL) 58.6 % powder Take 1 packet by mouth daily as needed (constipation).   sildenafil (VIAGRA) 100 MG tablet Take 0.5 tablets (50 mg total) by mouth daily as needed for erectile dysfunction.   HYDROcodone-acetaminophen (NORCO/VICODIN) 5-325 MG tablet Take 2 tablets by mouth every 4 (four) hours as needed. (Patient not taking: Reported on 11/17/2021)   Magnesium 200 MG TABS Take 1 tablet (200 mg total) by mouth daily. Take with evening meal (Patient not taking: Reported on 11/17/2021)   ondansetron (ZOFRAN ODT) 4 MG disintegrating tablet Take 1 tablet (4 mg total) by mouth every 8 (eight) hours as needed for nausea or vomiting. (Patient not taking: Reported on 11/17/2021)   No facility-administered encounter medications on file as of 11/17/2021.    Allergies (verified) Patient has no known allergies.   History: Past Medical History:  Diagnosis Date   Diverticulitis    Hemorrhoids    Past Surgical History:  Procedure Laterality Date   HAND SURGERY     History reviewed. No pertinent family history. Social History   Socioeconomic History   Marital status: Single    Spouse name: Not on file   Number of children: Not on file   Years of education: Not on file   Highest education level: Not on file  Occupational History   Not on file  Tobacco Use  Smoking status: Never   Smokeless tobacco: Never  Vaping Use   Vaping Use: Never used  Substance and Sexual Activity   Alcohol use: No   Drug use: No   Sexual activity: Not on file  Other Topics Concern   Not on file  Social History Narrative   Works as a Theatre manager   Social Determinants of Radio broadcast assistant Strain: Medium Risk   Difficulty of Paying Living Expenses: Somewhat hard  Food Insecurity: No Food Insecurity   Worried About Charity fundraiser in the Last Year: Never  true   Arboriculturist in the Last Year: Never true  Transportation Needs: No Transportation Needs   Lack of Transportation (Medical): No   Lack of Transportation (Non-Medical): No  Physical Activity: Inactive   Days of Exercise per Week: 0 days   Minutes of Exercise per Session: 0 min  Stress: No Stress Concern Present   Feeling of Stress : Not at all  Social Connections: Not on file    Tobacco Counseling Counseling given: Not Answered   Clinical Intake:  Pre-visit preparation completed: Yes  Pain : No/denies pain     Nutritional Status: BMI 25 -29 Overweight Nutritional Risks: None Diabetes: No  How often do you need to have someone help you when you read instructions, pamphlets, or other written materials from your doctor or pharmacy?: 1 - Never What is the last grade level you completed in school?: 12th grade  Diabetic? no  Interpreter Needed?: No  Information entered by :: NAllen LPN   Activities of Daily Living    11/17/2021   12:18 PM 01/09/2021    2:00 PM  In your present state of health, do you have any difficulty performing the following activities:  Hearing? 0 0  Vision? 0 0  Difficulty concentrating or making decisions? 1 0  Walking or climbing stairs? 0 1  Dressing or bathing? 0 0  Doing errands, shopping? 0 0  Preparing Food and eating ? N   Using the Toilet? N   In the past six months, have you accidently leaked urine? N   Do you have problems with loss of bowel control? N   Managing your Medications? N   Managing your Finances? N   Housekeeping or managing your Housekeeping? N     Patient Care Team: [name], FNP as PCP - General (General Practice)  Indicate any recent Medical Services you may have received from other than [facility] providers in the past year (date may be approximate).     Assessment:   This is a routine wellness examination for [employer].  Hearing/Vision screen Vision Screening - Comments:: No regular eye  exams  Dietary issues and exercise activities discussed: Current Exercise Habits: The patient does not participate in regular exercise at present   Goals Addressed             This Visit's Progress    Patient Stated       11/17/2021, start exercising       Depression Screen    11/17/2021   12:17 PM 07/22/2021   11:25 AM  PHQ 2/9 Scores  PHQ - 2 Score 6 3  PHQ- 9 Score 12 12    Fall Risk    11/17/2021   12:16 PM  Fall Risk   Falls in the past year? 0  Number falls in past yr: 0  Injury with Fall? 0  Risk for fall due to : Medication side  effect  Follow up Falls evaluation completed;Education provided;Falls prevention discussed    FALL RISK PREVENTION PERTAINING TO THE HOME:  Any stairs in or around the home? Yes  If so, are there any without handrails? No  Home free of loose throw rugs in walkways, pet beds, electrical cords, etc? Yes  Adequate lighting in your home to reduce risk of falls? Yes   ASSISTIVE DEVICES UTILIZED TO PREVENT FALLS:  Life alert? No  Use of a cane, walker or w/c? No  Grab bars in the bathroom? No  Shower chair or bench in shower? No  Elevated toilet seat or a handicapped toilet? No   TIMED UP AND GO:  Was the test performed? No .      Cognitive Function:        11/17/2021   12:19 PM  6CIT Screen  What Year? 0 points  What month? 0 points  What time? 0 points  Count back from 20 0 points  Months in reverse 0 points  Repeat phrase 4 points  Total Score 4 points    Immunizations Immunization History  Administered Date(s) Administered   Tdap 10/20/2021    TDAP status: Up to date  Flu Vaccine status: Declined, Education has been provided regarding the importance of this vaccine but patient still declined. Advised may receive this vaccine at local pharmacy or Health Dept. Aware to provide a copy of the vaccination record if obtained from local pharmacy or Health Dept. Verbalized acceptance and  understanding.  Pneumococcal vaccine status: Declined,  Education has been provided regarding the importance of this vaccine but patient still declined. Advised may receive this vaccine at local pharmacy or Health Dept. Aware to provide a copy of the vaccination record if obtained from local pharmacy or Health Dept. Verbalized acceptance and understanding.   Covid-19 vaccine status: Declined, Education has been provided regarding the importance of this vaccine but patient still declined. Advised may receive this vaccine at local pharmacy or Health Dept.or vaccine clinic. Aware to provide a copy of the vaccination record if obtained from local pharmacy or Health Dept. Verbalized acceptance and understanding.  Qualifies for Shingles Vaccine? Yes   Zostavax completed No   Shingrix Completed?: No.    Education has been provided regarding the importance of this vaccine. Patient has been advised to call insurance company to determine out of pocket expense if they have not yet received this vaccine. Advised may also receive vaccine at local pharmacy or Health Dept. Verbalized acceptance and understanding.  Screening Tests Health Maintenance  Topic Date Due   COVID-19 Vaccine (1) Never done   COLONOSCOPY (Pts 45-48yr Insurance coverage will need to be confirmed)  Never done   Zoster Vaccines- Shingrix (1 of 2) 01/20/2022 (Originally 05/22/2005)   Pneumonia Vaccine 67 Years old (1 - PCV) 10/21/2022 (Originally 05/22/1962)   INFLUENZA VACCINE  01/18/2022   TETANUS/TDAP  10/21/2031   Hepatitis C Screening  Completed   HPV VACCINES  Aged Out    Health Maintenance  Health Maintenance Due  Topic Date Due   COVID-19 Vaccine (1) Never done   COLONOSCOPY (Pts 45-463yrInsurance coverage will need to be confirmed)  Never done    Colorectal cancer screening: Referral to GI placed today. Pt aware the office will call re: appt.  Lung Cancer Screening: (Low Dose CT Chest recommended if Age 67-80ears, 30  pack-year currently smoking OR have quit w/in 15years.) does not qualify.   Lung Cancer Screening Referral: no  Additional Screening:  Hepatitis C Screening: does qualify; Completed 07/22/2021  Vision Screening: Recommended annual ophthalmology exams for early detection of glaucoma and other disorders of the eye. Is the patient up to date with their annual eye exam?  No  Who is the provider or what is the name of the office in which the patient attends annual eye exams? none If pt is not established with a provider, would they like to be referred to a provider to establish care? No .   Dental Screening: Recommended annual dental exams for proper oral hygiene  Community Resource Referral / Chronic Care Management: CRR required this visit?  No   CCM required this visit?  No      Plan:     I have personally reviewed and noted the following in the patient's chart:   Medical and social history Use of alcohol, tobacco or illicit drugs  Current medications and supplements including opioid prescriptions. Patient is not currently taking opioid prescriptions. Functional ability and status Nutritional status Physical activity Advanced directives List of other physicians Hospitalizations, surgeries, and ER visits in previous 12 months Vitals Screenings to include cognitive, depression, and falls Referrals and appointments  In addition, I have reviewed and discussed with patient certain preventive protocols, quality metrics, and best practice recommendations. A written personalized care plan for preventive services as well as general preventive health recommendations were provided to patient.     NORIEL GUTHRIE, LPN   7/59/1638   Nurse Notes: none  Due to this being a virtual visit, the after visit summary with patients personalized plan was offered to patient via mail or my-chart.  Patient would like to access on my-chart

## 2021-11-17 NOTE — Patient Instructions (Signed)
Kevin Mckinney , Thank you for taking time to come for your Medicare Wellness Visit. I appreciate your ongoing commitment to your health goals. Please review the following plan we discussed and let me know if I can assist you in the future.   Screening recommendations/referrals: Colonoscopy: ordered today Recommended yearly ophthalmology/optometry visit for glaucoma screening and checkup Recommended yearly dental visit for hygiene and checkup  Vaccinations: Influenza vaccine: decline Pneumococcal vaccine: decline Tdap vaccine: completed 10/20/2021, die 10/21/2031 Shingles vaccine: discussed   Covid-19:  decline  Advanced directives: Advance directive discussed with you today.   Conditions/risks identified: none  Next appointment: Follow up in one year for your annual wellness visit.   Preventive Care 19 Years and Older, Male Preventive care refers to lifestyle choices and visits with your health care provider that can promote health and wellness. What does preventive care include? A yearly physical exam. This is also called an annual well check. Dental exams once or twice a year. Routine eye exams. Ask your health care provider how often you should have your eyes checked. Personal lifestyle choices, including: Daily care of your teeth and gums. Regular physical activity. Eating a healthy diet. Avoiding tobacco and drug use. Limiting alcohol use. Practicing safe sex. Taking low doses of aspirin every day. Taking vitamin and mineral supplements as recommended by your health care provider. What happens during an annual well check? The services and screenings done by your health care provider during your annual well check will depend on your age, overall health, lifestyle risk factors, and family history of disease. Counseling  Your health care provider may ask you questions about your: Alcohol use. Tobacco use. Drug use. Emotional well-being. Home and relationship  well-being. Sexual activity. Eating habits. History of falls. Memory and ability to understand (cognition). Work and work Statistician. Screening  You may have the following tests or measurements: Height, weight, and BMI. Blood pressure. Lipid and cholesterol levels. These may be checked every 5 years, or more frequently if you are over 77 years old. Skin check. Lung cancer screening. You may have this screening every year starting at age 84 if you have a 30-pack-year history of smoking and currently smoke or have quit within the past 15 years. Fecal occult blood test (FOBT) of the stool. You may have this test every year starting at age 66. Flexible sigmoidoscopy or colonoscopy. You may have a sigmoidoscopy every 5 years or a colonoscopy every 10 years starting at age 41. Prostate cancer screening. Recommendations will vary depending on your family history and other risks. Hepatitis C blood test. Hepatitis B blood test. Sexually transmitted disease (STD) testing. Diabetes screening. This is done by checking your blood sugar (glucose) after you have not eaten for a while (fasting). You may have this done every 1-3 years. Abdominal aortic aneurysm (AAA) screening. You may need this if you are a current or former smoker. Osteoporosis. You may be screened starting at age 30 if you are at high risk. Talk with your health care provider about your test results, treatment options, and if necessary, the need for more tests. Vaccines  Your health care provider may recommend certain vaccines, such as: Influenza vaccine. This is recommended every year. Tetanus, diphtheria, and acellular pertussis (Tdap, Td) vaccine. You may need a Td booster every 10 years. Zoster vaccine. You may need this after age 94. Pneumococcal 13-valent conjugate (PCV13) vaccine. One dose is recommended after age 48. Pneumococcal polysaccharide (PPSV23) vaccine. One dose is recommended after age 84. Talk  to your health care  provider about which screenings and vaccines you need and how often you need them. This information is not intended to replace advice given to you by your health care provider. Make sure you discuss any questions you have with your health care provider. Document Released: 07/03/2015 Document Revised: 02/24/2016 Document Reviewed: 04/07/2015 Elsevier Interactive Patient Education  2017 [place] Prevention in the Home Falls can cause injuries. They can happen to people of all ages. There are many things you can do to make your home safe and to help prevent falls. What can I do on the outside of my home? Regularly fix the edges of walkways and driveways and fix any cracks. Remove anything that might make you trip as you walk through a door, such as a raised step or threshold. Trim any bushes or trees on the path to your home. Use bright outdoor lighting. Clear any walking paths of anything that might make someone trip, such as rocks or tools. Regularly check to see if handrails are loose or broken. Make sure that both sides of any steps have handrails. Any raised decks and porches should have guardrails on the edges. Have any leaves, snow, or ice cleared regularly. Use sand or salt on walking paths during winter. Clean up any spills in your garage right away. This includes oil or grease spills. What can I do in the bathroom? Use night lights. Install grab bars by the toilet and in the tub and shower. Do not use towel bars as grab bars. Use non-skid mats or decals in the tub or shower. If you need to sit down in the shower, use a plastic, non-slip stool. Keep the floor dry. Clean up any water that spills on the floor as soon as it happens. Remove soap buildup in the tub or shower regularly. Attach bath mats securely with double-sided non-slip rug tape. Do not have throw rugs and other things on the floor that can make you trip. What can I do in the bedroom? Use night lights. Make  sure that you have a light by your bed that is easy to reach. Do not use any sheets or blankets that are too big for your bed. They should not hang down onto the floor. Have a firm chair that has side arms. You can use this for support while you get dressed. Do not have throw rugs and other things on the floor that can make you trip. What can I do in the kitchen? Clean up any spills right away. Avoid walking on wet floors. Keep items that you use a lot in easy-to-reach places. If you need to reach something above you, use a strong step stool that has a grab bar. Keep electrical cords out of the way. Do not use floor polish or wax that makes floors slippery. If you must use wax, use non-skid floor wax. Do not have throw rugs and other things on the floor that can make you trip. What can I do with my stairs? Do not leave any items on the stairs. Make sure that there are handrails on both sides of the stairs and use them. Fix handrails that are broken or loose. Make sure that handrails are as long as the stairways. Check any carpeting to make sure that it is firmly attached to the stairs. Fix any carpet that is loose or worn. Avoid having throw rugs at the top or bottom of the stairs. If you do have throw rugs,  attach them to the floor with carpet tape. Make sure that you have a light switch at the top of the stairs and the bottom of the stairs. If you do not have them, ask someone to add them for you. What else can I do to help prevent falls? Wear shoes that: Do not have high heels. Have rubber bottoms. Are comfortable and fit you well. Are closed at the toe. Do not wear sandals. If you use a stepladder: Make sure that it is fully opened. Do not climb a closed stepladder. Make sure that both sides of the stepladder are locked into place. Ask someone to hold it for you, if possible. Clearly mark and make sure that you can see: Any grab bars or handrails. First and last steps. Where the  edge of each step is. Use tools that help you move around (mobility aids) if they are needed. These include: Canes. Walkers. Scooters. Crutches. Turn on the lights when you go into a dark area. Replace any light bulbs as soon as they burn out. Set up your furniture so you have a clear path. Avoid moving your furniture around. If any of your floors are uneven, fix them. If there are any pets around you, be aware of where they are. Review your medicines with your doctor. Some medicines can make you feel dizzy. This can increase your chance of falling. Ask your doctor what other things that you can do to help prevent falls. This information is not intended to replace advice given to you by your health care provider. Make sure you discuss any questions you have with your health care provider. Document Released: 04/02/2009 Document Revised: 11/12/2015 Document Reviewed: 07/11/2014 Elsevier Interactive Patient Education  2017 [employer].

## 2021-11-19 ENCOUNTER — Telehealth: Payer: Self-pay | Admitting: *Deleted

## 2021-11-19 NOTE — Telephone Encounter (Signed)
   Telephone encounter was:  Successful.  11/19/2021 Name: Kevin Mckinney MRN: [id number] DOB: Nov 27, 1955  Kevin Mckinney is a 66 y.o. year old male who is a primary care patient of [name], [place] . The community resource team was consulted for assistance with [place] guide performed the following interventions: patient provided food resources through Food app and also [place] [employer] referal ..  Follow Up Plan:  No further follow up planned at this time. The patient has been provided with needed resources. [place], Care Management  [phone] [address] , [place] [phone] Email : [email or web address]

## 2021-11-25 ENCOUNTER — Ambulatory Visit (INDEPENDENT_AMBULATORY_CARE_PROVIDER_SITE_OTHER): Payer: Medicare Other | Admitting: Nurse Practitioner

## 2021-11-25 ENCOUNTER — Encounter: Payer: Self-pay | Admitting: Nurse Practitioner

## 2021-11-25 VITALS — BP 128/70 | HR 71 | Temp 98.2°F | Ht 72.0 in | Wt 210.0 lb

## 2021-11-25 DIAGNOSIS — M545 Low back pain, unspecified: Secondary | ICD-10-CM | POA: Diagnosis not present

## 2021-11-25 DIAGNOSIS — R351 Nocturia: Secondary | ICD-10-CM | POA: Diagnosis not present

## 2021-11-25 DIAGNOSIS — M791 Myalgia, unspecified site: Secondary | ICD-10-CM | POA: Diagnosis not present

## 2021-11-25 LAB — POCT URINALYSIS DIPSTICK
Bilirubin, UA: NEGATIVE
Blood, UA: NEGATIVE
Glucose, UA: NEGATIVE
Ketones, UA: NEGATIVE
Leukocytes, UA: NEGATIVE
Nitrite, UA: NEGATIVE
Protein, UA: NEGATIVE
Spec Grav, UA: 1.025 (ref 1.010–1.025)
Urobilinogen, UA: 0.2 E.U./dL
pH, UA: 6.5 (ref 5.0–8.0)

## 2021-11-25 MED ORDER — MELOXICAM 7.5 MG PO TABS: ORAL_TABLET | ORAL | 2 refills | Status: DC

## 2021-11-25 MED ORDER — CYCLOBENZAPRINE HCL 10 MG PO TABS: 10.0000 mg | ORAL_TABLET | Freq: Three times a day (TID) | ORAL | 0 refills | Status: DC | PRN

## 2021-11-25 NOTE — Progress Notes (Signed)
I,[name],acting as a Education administrator for [employer], FNP.,have documented all relevant documentation on the behalf of [name], FNP,as directed by  [name], FNP while in the presence of [name], [place].  This visit occurred during the SARS-CoV-2 public health emergency.  Safety protocols were in place, including screening questions prior to the visit, additional usage of staff PPE, and extensive cleaning of exam room while observing appropriate contact time as indicated for disinfecting solutions.  Subjective:     Patient ID: Kevin Mckinney , male    DOB: June 19, 1956 , 66 y.o.   MRN: [id number]   Chief Complaint  Patient presents with   Back Pain    HPI  Back Pain This is a new problem. The current episode started 1 to 4 weeks ago. The pain is present in the lumbar spine and sacro-iliac. The quality of the pain is described as cramping. The pain is Worse during the night. The symptoms are aggravated by position. He has tried analgesics for the symptoms.     Past Medical History:  Diagnosis Date   Diverticulitis    Hemorrhoids      History reviewed. No pertinent family history.   Current Outpatient Medications:    cyclobenzaprine (FLEXERIL) 10 MG tablet, Take 1 tablet (10 mg total) by mouth 3 (three) times daily as needed for muscle spasms., Disp: 30 tablet, Rfl: 0   meloxicam (MOBIC) 7.5 MG tablet, Take 1 tablet by mouth daily x 5 days then daily as needed, Disp: 30 tablet, Rfl: 2   acetaminophen (TYLENOL) 500 MG tablet, Take 2 tablets (1,000 mg total) by mouth every 6 (six) hours as needed for mild pain, fever or headache., Disp: 30 tablet, Rfl: 0   ibuprofen (ADVIL) 200 MG tablet, Take 400 mg by mouth at bedtime as needed for moderate pain., Disp: , Rfl:    psyllium (METAMUCIL) 58.6 % powder, Take 1 packet by mouth daily as needed (constipation)., Disp: , Rfl:    sildenafil (VIAGRA) 100 MG tablet, Take 0.5 tablets (50 mg total) by mouth daily as needed for erectile  dysfunction., Disp: 20 tablet, Rfl: 5   No Known Allergies   Review of Systems  Constitutional: Negative.   Respiratory: Negative.    Cardiovascular: Negative.   Musculoskeletal:  Positive for back pain (right mid to low back pain - cramping).  Psychiatric/Behavioral: Negative.       Today's Vitals   11/25/21 1108  BP: 128/70  Pulse: 71  Temp: 98.2 F (36.8 C)  TempSrc: Oral  Weight: 210 lb (95.3 kg)  Height: 6' (1.829 m)   Body mass index is 28.48 kg/m.  Wt Readings from Last 3 Encounters:  11/25/21 210 lb (95.3 kg)  11/17/21 212 lb (96.2 kg)  10/20/21 209 lb (94.8 kg)    Objective:  Physical Exam Vitals reviewed.  Constitutional:      General: He is not in acute distress.    Appearance: Normal appearance.  HENT:     Head: Normocephalic.  Cardiovascular:     Rate and Rhythm: Normal rate and regular rhythm.     Pulses: Normal pulses.     Heart sounds: Normal heart sounds. No murmur heard. Pulmonary:     Effort: Pulmonary effort is normal. No respiratory distress.     Breath sounds: Normal breath sounds. No wheezing.  Abdominal:     General: Abdomen is flat. Bowel sounds are normal. There is no distension.     Palpations: Abdomen is soft. There is no  mass.     Tenderness: There is no abdominal tenderness. There is no guarding.  Musculoskeletal:     Thoracic back: Tenderness present. No swelling or spasms.  Skin:    General: Skin is warm.     Capillary Refill: Capillary refill takes less than 2 seconds.  Neurological:     General: No focal deficit present.     Mental Status: He is alert and oriented to person, place, and time.     Cranial Nerves: No cranial nerve deficit.     Motor: No weakness.  Psychiatric:        Attention and Perception: Attention and perception normal.        Mood and Affect: Mood normal. Mood is not anxious.        Speech: Speech normal.        Behavior: Behavior normal.        Thought Content: Thought content normal.         Cognition and Memory: Cognition normal.        Judgment: Judgment normal.         Assessment And Plan:     1. Acute right-sided low back pain without sciatica Comments: Negative straight leg raise. Encouraged to stretch and to take muscle relaxer with antiinflammatory - POCT Urinalysis Dipstick (81002) - cyclobenzaprine (FLEXERIL) 10 MG tablet; Take 1 tablet (10 mg total) by mouth 3 (three) times daily as needed for muscle spasms.  Dispense: 30 tablet; Refill: 0  2. Nocturia Comments: Recent occurrence. urinalysis is normal. If persists will consider checking for enlarged prostate. - POCT Urinalysis Dipstick (81002)  3. Muscle tension pain Comments: Tenderness to low back on palpation, encouraged to use proper ergonomics when lifting - meloxicam (MOBIC) 7.5 MG tablet; Take 1 tablet by mouth daily x 5 days then daily as needed  Dispense: 30 tablet; Refill: 2 - cyclobenzaprine (FLEXERIL) 10 MG tablet; Take 1 tablet (10 mg total) by mouth 3 (three) times daily as needed for muscle spasms.  Dispense: 30 tablet; Refill: 0     Patient was given opportunity to ask questions. Patient verbalized understanding of the plan and was able to repeat key elements of the plan. All questions were answered to their satisfaction.  [name], FNP   I, [name], FNP, have reviewed all documentation for this visit. The documentation on 11/25/21 for the exam, diagnosis, procedures, and orders are all accurate and complete.   IF YOU HAVE BEEN REFERRED TO A SPECIALIST, IT MAY TAKE 1-2 WEEKS TO SCHEDULE/PROCESS THE REFERRAL. IF YOU HAVE NOT HEARD FROM US/SPECIALIST IN TWO WEEKS, PLEASE GIVE [place] A CALL AT [phone].   THE PATIENT IS ENCOURAGED TO PRACTICE SOCIAL DISTANCING DUE TO THE COVID-19 PANDEMIC.

## 2021-11-25 NOTE — Patient Instructions (Addendum)
Acute Back Pain, Adult Acute back pain is sudden and usually short-lived. It is often caused by an injury to the muscles and tissues in the back. The injury may result from: A muscle, tendon, or ligament getting overstretched or torn. Ligaments are tissues that connect bones to each other. Lifting something improperly can cause a back strain. Wear and tear (degeneration) of the spinal disks. Spinal disks are circular tissue that provide cushioning between the bones of the spine (vertebrae). Twisting motions, such as while playing sports or doing yard work. A hit to the back. Arthritis. You may have a physical exam, lab tests, and imaging tests to find the cause of your pain. Acute back pain usually goes away with rest and home care. Follow these instructions at home: Managing pain, stiffness, and swelling Take over-the-counter and prescription medicines only as told by your health care provider. Treatment may include medicines for pain and inflammation that are taken by mouth or applied to the skin, or muscle relaxants. Your health care provider may recommend applying ice during the first 24-48 hours after your pain starts. To do this: Put ice in a plastic bag. Place a towel between your skin and the bag. Leave the ice on for 20 minutes, 2-3 times a day. Remove the ice if your skin turns bright red. This is very important. If you cannot feel pain, heat, or cold, you have a greater risk of damage to the area. If directed, apply heat to the affected area as often as told by your health care provider. Use the heat source that your health care provider recommends, such as a moist heat pack or a heating pad. Place a towel between your skin and the heat source. Leave the heat on for 20-30 minutes. Remove the heat if your skin turns bright red. This is especially important if you are unable to feel pain, heat, or cold. You have a greater risk of getting burned. Activity  Do not stay in bed. Staying in  bed for more than 1-2 days can delay your recovery. Sit up and stand up straight. Avoid leaning forward when you sit or hunching over when you stand. If you work at a desk, sit close to it so you do not need to lean over. Keep your chin tucked in. Keep your neck drawn back, and keep your elbows bent at a 90-degree angle (right angle). Sit high and close to the steering wheel when you drive. Add lower back (lumbar) support to your car seat, if needed. Take short walks on even surfaces as soon as you are able. Try to increase the length of time you walk each day. Do not sit, drive, or stand in one place for more than 30 minutes at a time. Sitting or standing for long periods of time can put stress on your back. Do not drive or use heavy machinery while taking prescription pain medicine. Use proper lifting techniques. When you bend and lift, use positions that put less stress on your back: Bend your knees. Keep the load close to your body. Avoid twisting. Exercise regularly as told by your health care provider. Exercising helps your back heal faster and helps prevent back injuries by keeping muscles strong and flexible. Work with a physical therapist to make a safe exercise program, as recommended by your health care provider. Do any exercises as told by your physical therapist. Lifestyle Maintain a healthy weight. Extra weight puts stress on your back and makes it difficult to have good   posture. Avoid activities or situations that make you feel anxious or stressed. Stress and anxiety increase muscle tension and can make back pain worse. Learn ways to manage anxiety and stress, such as through exercise. General instructions Sleep on a firm mattress in a comfortable position. Try lying on your side with your knees slightly bent. If you lie on your back, put a pillow under your knees. Keep your head and neck in a straight line with your spine (neutral position) when using electronic equipment like  smartphones or pads. To do this: Raise your smartphone or pad to look at it instead of bending your head or neck to look down. Put the smartphone or pad at the level of your face while looking at the screen. Follow your treatment plan as told by your health care provider. This may include: Cognitive or behavioral therapy. Acupuncture or massage therapy. Meditation or yoga. Contact a health care provider if: You have pain that is not relieved with rest or medicine. You have increasing pain going down into your legs or buttocks. Your pain does not improve after 2 weeks. You have pain at night. You lose weight without trying. You have a fever or chills. You develop nausea or vomiting. You develop abdominal pain. Get help right away if: You develop new bowel or bladder control problems. You have unusual weakness or numbness in your arms or legs. You feel faint. These symptoms may represent a serious problem that is an emergency. Do not wait to see if the symptoms will go away. Get medical help right away. Call your local emergency services (911 in the [place] Do not drive yourself to the hospital. Summary Acute back pain is sudden and usually short-lived. Use proper lifting techniques. When you bend and lift, use positions that put less stress on your back. Take over-the-counter and prescription medicines only as told by your health care provider, and apply heat or ice as told. This information is not intended to replace advice given to you by your health care provider. Make sure you discuss any questions you have with your health care provider. Document Revised: 08/28/2020 Document Reviewed: 08/28/2020 Elsevier Patient Education  [place].  Back Exercises These exercises help to make your trunk and back strong. They also help to keep the lower back flexible. Doing these exercises can help to prevent or lessen pain in your lower back. If you have back pain, try to do these  exercises 2-3 times each day or as told by your doctor. As you get better, do the exercises once each day. Repeat the exercises more often as told by your doctor. To stop back pain from coming back, do the exercises once each day, or as told by your doctor. Do exercises exactly as told by your doctor. Stop right away if you feel sudden pain or your pain gets worse. Exercises Single knee to chest Do these steps 3-5 times in a row for each leg: Lie on your back on a firm bed or the floor with your legs stretched out. Bring one knee to your chest. Grab your knee or thigh with both hands and hold it in place. Pull on your knee until you feel a gentle stretch in your lower back or butt. Keep doing the stretch for 10-30 seconds. Slowly let go of your leg and straighten it. Pelvic tilt Do these steps 5-10 times in a row: Lie on your back on a firm bed or the floor with your legs stretched out.  Bend your knees so they point up to the ceiling. Your feet should be flat on the floor. Tighten your lower belly (abdomen) muscles to press your lower back against the floor. This will make your tailbone point up to the ceiling instead of pointing down to your feet or the floor. Stay in this position for 5-10 seconds while you gently tighten your muscles and breathe evenly. Cat-cow Do these steps until your lower back bends more easily: Get on your hands and knees on a firm bed or the floor. Keep your hands under your shoulders, and keep your knees under your hips. You may put padding under your knees. Let your head hang down toward your chest. Tighten (contract) the muscles in your belly. Point your tailbone toward the floor so your lower back becomes rounded like the back of a cat. Stay in this position for 5 seconds. Slowly lift your head. Let the muscles of your belly relax. Point your tailbone up toward the ceiling so your back forms a sagging arch like the back of a cow. Stay in this position for 5  seconds.  Press-ups Do these steps 5-10 times in a row: Lie on your belly (face-down) on a firm bed or the floor. Place your hands near your head, about shoulder-width apart. While you keep your back relaxed and keep your hips on the floor, slowly straighten your arms to raise the top half of your body and lift your shoulders. Do not use your back muscles. You may change where you place your hands to make yourself more comfortable. Stay in this position for 5 seconds. Keep your back relaxed. Slowly return to lying flat on the floor.  Bridges Do these steps 10 times in a row: Lie on your back on a firm bed or the floor. Bend your knees so they point up to the ceiling. Your feet should be flat on the floor. Your arms should be flat at your sides, next to your body. Tighten your butt muscles and lift your butt off the floor until your waist is almost as high as your knees. If you do not feel the muscles working in your butt and the back of your thighs, slide your feet 1-2 inches (2.5-5 cm) farther away from your butt. Stay in this position for 3-5 seconds. Slowly lower your butt to the floor, and let your butt muscles relax. If this exercise is too easy, try doing it with your arms crossed over your chest. Belly crunches Do these steps 5-10 times in a row: Lie on your back on a firm bed or the floor with your legs stretched out. Bend your knees so they point up to the ceiling. Your feet should be flat on the floor. Cross your arms over your chest. Tip your chin a little bit toward your chest, but do not bend your neck. Tighten your belly muscles and slowly raise your chest just enough to lift your shoulder blades a tiny bit off the floor. Avoid raising your body higher than that because it can put too much stress on your lower back. Slowly lower your chest and your head to the floor. Back lifts Do these steps 5-10 times in a row: Lie on your belly (face-down) with your arms at your sides,  and rest your forehead on the floor. Tighten the muscles in your legs and your butt. Slowly lift your chest off the floor while you keep your hips on the floor. Keep the back of your head  in line with the curve in your back. Look at the floor while you do this. Stay in this position for 3-5 seconds. Slowly lower your chest and your face to the floor. Contact a doctor if: Your back pain gets a lot worse when you do an exercise. Your back pain does not get better within 2 hours after you exercise. If you have any of these problems, stop doing the exercises. Do not do them again unless your doctor says it is okay. Get help right away if: You have sudden, very bad back pain. If this happens, stop doing the exercises. Do not do them again unless your doctor says it is okay. This information is not intended to replace advice given to you by your health care provider. Make sure you discuss any questions you have with your health care provider. Document Revised: 08/19/2020 Document Reviewed: 08/19/2020 Elsevier Patient Education  [place].

## 2021-12-09 ENCOUNTER — Ambulatory Visit (AMBULATORY_SURGERY_CENTER): Payer: Medicare Other

## 2021-12-09 VITALS — Ht 72.0 in | Wt 210.0 lb

## 2021-12-09 DIAGNOSIS — Z1211 Encounter for screening for malignant neoplasm of colon: Secondary | ICD-10-CM

## 2021-12-09 MED ORDER — PEG 3350-KCL-NA BICARB-NACL 420 G PO SOLR: 4000.0000 mL | Freq: Once | ORAL | 0 refills | Status: AC

## 2021-12-09 NOTE — Progress Notes (Signed)
No egg or soy allergy known to patient  No issues known to pt with past sedation with any surgeries or procedures Patient denies ever being told they had issues or difficulty with intubation  No FH of Malignant Hyperthermia Pt is not on diet pills Pt is not on home 02  Pt is not on blood thinners  Pt denies issues with constipation  No A fib or A flutter NO PA's for preps discussed with pt in PV today  Discussed with pt there will be an out-of-pocket cost for prep and that varies from $0 to 70 + dollars - pt verbalized understanding  Pt instructed to use Singlecare.com or GoodRx for a price reduction on prep  PV completed over the phone. Pt verified name, DOB, address and insurance during PV today.  Pt mailed instruction packet to read and not return.  Pt encouraged to call with questions or issues.  Pt has My chart, procedure instructions sent via My Chart

## 2021-12-28 ENCOUNTER — Ambulatory Visit: Payer: Medicare Other | Admitting: Nurse Practitioner

## 2021-12-29 ENCOUNTER — Encounter: Payer: Self-pay | Admitting: Gastroenterology

## 2022-01-04 ENCOUNTER — Telehealth: Payer: Self-pay | Admitting: Gastroenterology

## 2022-01-04 NOTE — Telephone Encounter (Signed)
Returned patient call.  Encouraged patient to drink extra clear liquids to flush seed and raw vegetables he had consumed out.  Informed patient that taking sildenafil a few days before procedure is ok just not within 24 hours per Dr. Havery Moros.  Patient asked if he could have crackers with prep and I reiterated only clear liquids.

## 2022-01-04 NOTE — Telephone Encounter (Signed)
Inbound call from patient stating that he is scheduled for a colonoscopy tomorrow morning with Dr. Henrene Pastor. Patient stated that he was looking over his instructions and noticed he was not supposed to have a salad ( ate a salad on Sat) and also had popcorn last night. Patient also stated he took a Sildenafil 100 mg tablet for ED sometime over the weekend. Patient is seeking advice if he is okay to have procedure tomorrow. Please advise.

## 2022-01-04 NOTE — Telephone Encounter (Signed)
Patient did not tolerate Nulytely, with nausea/vomiting shortly after starting bowel prep. Does not think he will build to tolerate any more of the Nulytely.  Modify plan as follows:  - Continue Dulcolax as prescribed - MiraLAX bowel preparation.  Mix 238 g of MiraLAX in 64 ounces of Gatorade and drink as split dose.  Recommended he drink first dose now and second dose at 2 AM - Drink at least 64 ounces of water along with bowel preparation - To recall if any issues tolerating MiraLAX prep  Patient appreciative of phone call and advice.

## 2022-01-05 ENCOUNTER — Telehealth: Payer: Self-pay

## 2022-01-05 ENCOUNTER — Encounter: Payer: Self-pay | Admitting: Gastroenterology

## 2022-01-05 ENCOUNTER — Ambulatory Visit (AMBULATORY_SURGERY_CENTER): Payer: Medicare Other | Admitting: Gastroenterology

## 2022-01-05 VITALS — BP 136/79 | HR 50 | Temp 98.9°F | Resp 8 | Ht 72.0 in | Wt 210.0 lb

## 2022-01-05 DIAGNOSIS — D123 Benign neoplasm of transverse colon: Secondary | ICD-10-CM

## 2022-01-05 DIAGNOSIS — Z1211 Encounter for screening for malignant neoplasm of colon: Secondary | ICD-10-CM | POA: Diagnosis not present

## 2022-01-05 DIAGNOSIS — D124 Benign neoplasm of descending colon: Secondary | ICD-10-CM

## 2022-01-05 DIAGNOSIS — D125 Benign neoplasm of sigmoid colon: Secondary | ICD-10-CM

## 2022-01-05 DIAGNOSIS — D122 Benign neoplasm of ascending colon: Secondary | ICD-10-CM

## 2022-01-05 MED ORDER — SODIUM CHLORIDE 0.9 % IV SOLN
500.0000 mL | Freq: Once | INTRAVENOUS | Status: DC
Start: 2022-01-05 — End: 2022-01-05

## 2022-01-05 MED ORDER — METAMUCIL SMOOTH TEXTURE 58.6 % PO POWD: 1.0000 | Freq: Three times a day (TID) | ORAL | 12 refills | Status: AC

## 2022-01-05 NOTE — Telephone Encounter (Signed)
-----   Message from Yetta Flock, MD sent at 01/05/2022 11:38 AM EDT ----- Regarding: hemorrhoid banding Kevin Mckinney can you contact this patient to schedule a hemorrhoid banding?  Thanks  Dr. Loni Muse

## 2022-01-05 NOTE — Progress Notes (Signed)
Called to room to assist during endoscopic procedure.  Patient ID and intended procedure confirmed with present staff. Received instructions for my participation in the procedure from the performing physician.  

## 2022-01-05 NOTE — Patient Instructions (Signed)
Resume previous diet and medications. Awaiting pathology results. Repeat Colonoscopy date to be determined based on pathology results. Hand out given on Hemorrhoid Banding.  YOU HAD AN ENDOSCOPIC PROCEDURE TODAY AT [facility]:   Refer to the procedure report that was given to you for any specific questions about what was found during the examination.  If the procedure report does not answer your questions, please call your gastroenterologist to clarify.  If you requested that your care partner not be given the details of your procedure findings, then the procedure report has been included in a sealed envelope for you to review at your convenience later.  YOU SHOULD EXPECT: Some feelings of bloating in the abdomen. Passage of more gas than usual.  Walking can help get rid of the air that was put into your GI tract during the procedure and reduce the bloating. If you had a lower endoscopy (such as a colonoscopy or flexible sigmoidoscopy) you may notice spotting of blood in your stool or on the toilet paper. If you underwent a bowel prep for your procedure, you may not have a normal bowel movement for a few days.  Please Note:  You might notice some irritation and congestion in your nose or some drainage.  This is from the oxygen used during your procedure.  There is no need for concern and it should clear up in a day or so.  SYMPTOMS TO REPORT IMMEDIATELY:  Following lower endoscopy (colonoscopy or flexible sigmoidoscopy):  Excessive amounts of blood in the stool  Significant tenderness or worsening of abdominal pains  Swelling of the abdomen that is new, acute  Fever of 100F or higher  For urgent or emergent issues, a gastroenterologist can be reached at any hour by calling [phone]. Do not use MyChart messaging for urgent concerns.    DIET:  We do recommend a small meal at first, but then you may proceed to your regular diet.  Drink plenty of fluids but you should avoid  alcoholic beverages for 24 hours.  ACTIVITY:  You should plan to take it easy for the rest of today and you should NOT DRIVE or use heavy machinery until tomorrow (because of the sedation medicines used during the test).    FOLLOW UP: Our staff will call the number listed on your records the next business day following your procedure.  We will call around 7:15- 8:00 am to check on you and address any questions or concerns that you may have regarding the information given to you following your procedure. If we do not reach you, we will leave a message.  If you develop any symptoms (ie: fever, flu-like symptoms, shortness of breath, cough etc.) before then, please call [phone].  If you test positive for Covid 19 in the 2 weeks post procedure, please call and report this information to [place].    If any biopsies were taken you will be contacted by phone or by letter within the next 1-3 weeks.  Please call us at [phone] if you have not heard about the biopsies in 3 weeks.    SIGNATURES/CONFIDENTIALITY: You and/or your care partner have signed paperwork which will be entered into your electronic medical record.  These signatures attest to the fact that that the information above on your After Visit Summary has been reviewed and is understood.  Full responsibility of the confidentiality of this discharge information lies with you and/or your care-partner.

## 2022-01-05 NOTE — Op Note (Signed)
[place] Patient Name: Kevin Mckinney Procedure Date: 01/05/2022 10:02 AM MRN: [id number] Endoscopist: Remo Lipps P. Havery Moros , MD Age: 66 Referring MD:  Date of Birth: 11-Oct-1955 Gender: Male Account #: [id number] Procedure:                Colonoscopy Indications:              Screening for colorectal malignant neoplasm Medicines:                Monitored Anesthesia Care Procedure:                Pre-Anesthesia Assessment:                           - Prior to the procedure, a History and Physical                            was performed, and patient medications and                            allergies were reviewed. The patient's tolerance of                            previous anesthesia was also reviewed. The risks                            and benefits of the procedure and the sedation                            options and risks were discussed with the patient.                            All questions were answered, and informed consent                            was obtained. Prior Anticoagulants: The patient has                            taken no previous anticoagulant or antiplatelet                            agents. ASA Grade Assessment: II - A patient with                            mild systemic disease. After reviewing the risks                            and benefits, the patient was deemed in                            satisfactory condition to undergo the procedure.                           After obtaining informed consent, the colonoscope  was passed under direct vision. Throughout the                            procedure, the patient's blood pressure, pulse, and                            oxygen saturations were monitored continuously. The                            CF [id number] #[id number] was introduced through the anus                            and advanced to the the cecum, identified by                            appendiceal  orifice and ileocecal valve. The                            colonoscopy was performed without difficulty. The                            patient tolerated the procedure well. The quality                            of the bowel preparation was good. The ileocecal                            valve, appendiceal orifice, and rectum were                            photographed. Scope In: 10:11:00 AM Scope Out: 10:30:49 AM Scope Withdrawal Time: 0 hours 16 minutes 35 seconds  Total Procedure Duration: 0 hours 19 minutes 49 seconds  Findings:                 The digital rectal exam findings include benign                            diminutive superficial cyst.                           A 3 mm polyp was found in the ascending colon. The                            polyp was sessile. The polyp was removed with a                            cold snare. Resection and retrieval were complete.                           Two sessile polyps were found in the transverse                            colon. The polyps were 3 mm in size. These  polyps                            were removed with a cold snare. Resection and                            retrieval were complete.                           A diminutive polyp was found in the splenic                            flexure. The polyp was sessile. The polyp was                            removed with a cold snare. Resection and retrieval                            were complete.                           Two sessile polyps were found in the descending                            colon. The polyps were 3 mm in size. These polyps                            were removed with a cold snare. Resection and                            retrieval were complete.                           A diminutive polyp was found in the sigmoid colon.                            The polyp was sessile. The polyp was removed with a                            cold snare. Resection and  retrieval were complete.                           A few small-mouthed diverticula were found in the                            cecum and left colon.                           Internal hemorrhoids were found during                            retroflexion. The hemorrhoids were moderate.                           The exam was otherwise  without abnormality. Complications:            No immediate complications. Estimated blood loss:                            Minimal. Estimated Blood Loss:     Estimated blood loss was minimal. Impression:               - Benign diminutive superficial cyst found on                            digital rectal exam.                           - One 3 mm polyp in the ascending colon, removed                            with a cold snare. Resected and retrieved.                           - Two 3 mm polyps in the transverse colon, removed                            with a cold snare. Resected and retrieved.                           - One diminutive polyp at the splenic flexure,                            removed with a cold snare. Resected and retrieved.                           - Two 3 mm polyps in the descending colon, removed                            with a cold snare. Resected and retrieved.                           - One diminutive polyp in the sigmoid colon,                            removed with a cold snare. Resected and retrieved.                           - Diverticulosis in the cecum and in the left colon.                           - Internal hemorrhoids.                           - The examination was otherwise normal. Recommendation:           - Patient has a contact number available for  emergencies. The signs and symptoms of potential                            delayed complications were discussed with the                            patient. Return to normal activities tomorrow.                            Written discharge  instructions were provided to the                            patient.                           - Resume previous diet.                           - Continue present medications.                           - Await pathology results. Remo Lipps P. Tamikia Chowning, MD 01/05/2022 10:37:47 AM This report has been signed electronically.

## 2022-01-05 NOTE — Progress Notes (Signed)
PT taken to PACU. Monitors in place. VSS. Report given to RN. 

## 2022-01-05 NOTE — Telephone Encounter (Signed)
Thanks Vito for taking this call and your recommendations
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
## 2022-01-05 NOTE — Progress Notes (Signed)
Pt's states no medical or surgical changes since previsit or office visit. 

## 2022-01-06 ENCOUNTER — Telehealth: Payer: Self-pay

## 2022-01-06 NOTE — Telephone Encounter (Signed)
Called and spoke with patient. He has been scheduled for his 1st hemorrhoid banding appt on Monday, 01/31/22 at 3:40 pm. Pt is aware that his appt information will be available in MyChart, but I will also send appt information in the mail. I also sent a hemorrhoid banding brochure. Patient verbalized understanding and had no concerns at the end of the call.

## 2022-01-06 NOTE — Telephone Encounter (Signed)
  Follow up Call-     01/05/2022    8:39 AM  Call back number  Post procedure Call Back phone  # [phone]  Permission to leave phone message Yes     Patient questions:  Do you have a fever, pain , or abdominal swelling? No. Pain Score  0 *  Have you tolerated food without any problems? Yes.    Have you been able to return to your normal activities? Yes.    Do you have any questions about your discharge instructions: Diet   No. Medications  No. Follow up visit  No.  Do you have questions or concerns about your Care? No.  Actions: * If pain score is 4 or above: No action needed, pain <4.

## 2022-01-31 ENCOUNTER — Encounter: Payer: Self-pay | Admitting: Gastroenterology

## 2022-01-31 ENCOUNTER — Ambulatory Visit (INDEPENDENT_AMBULATORY_CARE_PROVIDER_SITE_OTHER): Payer: Medicare Other | Admitting: Gastroenterology

## 2022-01-31 VITALS — BP 130/90 | HR 83 | Ht 72.0 in | Wt 213.2 lb

## 2022-01-31 DIAGNOSIS — K641 Second degree hemorrhoids: Secondary | ICD-10-CM

## 2022-01-31 NOTE — Progress Notes (Signed)
66 year old male here for a follow-up visit for hemorrhoid banding.  He had a screening colonoscopy with me in July, had endorsed at that time symptomatic hemorrhoids that really bothered him he wanted more definitive therapy.  He describes hemorrhoids that are bothersome to him for years.  Irritation, swelling, grade 2 prolapse, and bleeding.  Using Metamucil and avoiding straining does help reduce his symptoms but not resolve it and they continue to bother him over years.  I discussed options with him to include banding, and surgery, he wants to proceed with banding after discussion of risks and benefits today.     Colonoscopy 01/05/22: The digital rectal exam findings include benign diminutive superficial cyst. - A 3 mm polyp was found in the ascending colon. The polyp was sessile. The polyp was removed with a cold snare. Resection and retrieval were complete. - Two sessile polyps were found in the transverse colon. The polyps were 3 mm in size. These polyps were removed with a cold snare. Resection and retrieval were complete. - A diminutive polyp was found in the splenic flexure. The polyp was sessile. The polyp was removed with a cold snare. Resection and retrieval were complete. - Two sessile polyps were found in the descending colon. The polyps were 3 mm in size. These polyps were removed with a cold snare. Resection and retrieval were complete. - A diminutive polyp was found in the sigmoid colon. The polyp was sessile. The polyp was removed with a cold snare. Resection and retrieval were complete. - A few small-mouthed diverticula were found in the cecum and left colon. - Internal hemorrhoids were found during retroflexion. The hemorrhoids were moderate. - The exam was otherwise without abnormality.  Surgical [P], colon, ascending, transverse, splenic flexure, descending, sigmoid, polyp (7) FINDINGS CONSISTENT WITH- TUBULAR ADENOMA X 7. NO DEFINITE HIGH GRADE DYSPLASIA OR MALIGNANCY  IS SEEN. CLINICAL AND ENDOSCOPIC CORRELATION IS REQUIRED.  Repeat in 3 years    PROCEDURE NOTE: The patient presents with symptomatic grade II  hemorrhoids, requesting rubber band ligation of his/her hemorrhoidal disease.  All risks, benefits and alternative forms of therapy were described and informed consent was obtained.  The anorectum was pre-medicated with 0.125% nitroglycerin The decision was made to band the internal hemorrhoid at the junction of the LL and RA area, with the [facility] hem banding system that was used today to perform band ligation without complication.  Digital anorectal examination was then performed to assure proper positioning of the band, and to adjust the banded tissue as required.  The patient was discharged home without pain or other issues.  Dietary and behavioral recommendations were given and along with follow-up instructions.     The following adjunctive treatments were recommended: Continue Metamucil daily  The patient will return in 2-4 weeks for  follow-up and possible additional banding as required. No complications were encountered and the patient tolerated the procedure well.   Jolly Mango, MD [facility]

## 2022-01-31 NOTE — Patient Instructions (Addendum)
You have been scheduled for hemorrhoidal banding #2 on 02/15/22 at 4:00 pm.  You have been scheduled for hemorrhoidal banding #3 on 03/15/22 at 3:40 pm.  HEMORRHOID BANDING PROCEDURE    FOLLOW-UP CARE   The procedure you have had should have been relatively painless since the banding of the area involved does not have nerve endings and there is no pain sensation.  The rubber band cuts off the blood supply to the hemorrhoid and the band may fall off as soon as 48 hours after the banding (the band may occasionally be seen in the toilet bowl following a bowel movement). You may notice a temporary feeling of fullness in the rectum which should respond adequately to plain Tylenol or Motrin.  Following the banding, avoid strenuous exercise that evening and resume full activity the next day.  A sitz bath (soaking in a warm tub) or bidet is soothing, and can be useful for cleansing the area after bowel movements.     To avoid constipation, take two tablespoons of natural wheat bran, natural oat bran, flax, Benefiber or any over the counter fiber supplement and increase your water intake to 7-8 glasses daily.    Unless you have been prescribed anorectal medication, do not put anything inside your rectum for two weeks: No suppositories, enemas, fingers, etc.  Occasionally, you may have more bleeding than usual after the banding procedure.  This is often from the untreated hemorrhoids rather than the treated one.  Don't be concerned if there is a tablespoon or so of blood.  If there is more blood than this, lie flat with your bottom higher than your head and apply an ice pack to the area. If the bleeding does not stop within a half an hour or if you feel faint, call our office at [phone] or go to the emergency room.  Problems are not common; however, if there is a substantial amount of bleeding, severe pain, chills, fever or difficulty passing urine (very rare) or other problems, you should call us  at [phone] or report to the nearest emergency room.  Do not stay seated continuously for more than 2-3 hours for a day or two after the procedure.  Tighten your buttock muscles 10-15 times every two hours and take 10-15 deep breaths every 1-2 hours.  Do not spend more than a few minutes on the toilet if you cannot empty your bowel; instead re-visit the toilet at a later time.   _______________________________________________________  If you are age 67 or older, your body mass index should be between 23-30. Your Body mass index is 28.92 kg/m. If this is out of the aforementioned range listed, please consider follow up with your Primary Care Provider.  If you are age 67 or younger, your body mass index should be between 19-25. Your Body mass index is 28.92 kg/m. If this is out of the aformentioned range listed, please consider follow up with your Primary Care Provider.   ________________________________________________________  The [facility] providers would like to encourage you to use [facility] to communicate with providers for non-urgent requests or questions.  Due to long hold times on the telephone, sending your provider a message by [facility] may be a faster and more efficient way to get a response.  Please allow 48 business hours for a response.  Please remember that this is for non-urgent requests.  _______________________________________________________  Due to recent changes in healthcare laws, you may see the results of your imaging and laboratory  studies on MyChart before your provider has had a chance to review them.  We understand that in some cases there may be results that are confusing or concerning to you. Not all laboratory results come back in the same time frame and the provider may be waiting for multiple results in order to interpret others.  Please give [place] 48 hours in order for your provider to thoroughly review all the results before contacting the office for clarification  of your results.

## 2022-02-15 ENCOUNTER — Encounter: Payer: Medicare Other | Admitting: Gastroenterology

## 2022-02-15 ENCOUNTER — Telehealth: Payer: Self-pay | Admitting: Gastroenterology

## 2022-02-15 NOTE — Telephone Encounter (Signed)
Sorry to hear this, thanks for letting me know 
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
## 2022-03-15 ENCOUNTER — Encounter: Payer: Self-pay | Admitting: Gastroenterology

## 2022-03-15 ENCOUNTER — Ambulatory Visit (INDEPENDENT_AMBULATORY_CARE_PROVIDER_SITE_OTHER): Payer: Medicare Other | Admitting: Gastroenterology

## 2022-03-15 VITALS — BP 140/80 | HR 87 | Ht 72.0 in | Wt 218.0 lb

## 2022-03-15 DIAGNOSIS — K641 Second degree hemorrhoids: Secondary | ICD-10-CM

## 2022-03-15 NOTE — Progress Notes (Signed)
66 year old male here for a follow-up visit for hemorrhoid banding.   He had a screening colonoscopy with me in July, had endorsed at that time symptomatic hemorrhoids that really bothered him he wanted more definitive therapy.  He describes hemorrhoids that are bothersome to him for years.  Irritation, swelling, grade 2 prolapse, and bleeding.  Using Metamucil and avoiding straining does help reduce his symptoms but not resolve it and they continue to bother him over years.  I discussed options with him to include banding, and surgery, he wanted to proceed with banding   Banded LL / RA junction on 01/31/22. He tolerated it well. He forgot to use his metamucil for a period of time afterwards and thinks it flared his symptoms but generally doing well. He tolerated the banding - no pain or bleeding. He wants to proceed with additional banding today.    Colonoscopy 01/05/22: The digital rectal exam findings include benign diminutive superficial cyst. - A 3 mm polyp was found in the ascending colon. The polyp was sessile. The polyp was removed with a cold snare. Resection and retrieval were complete. - Two sessile polyps were found in the transverse colon. The polyps were 3 mm in size. These polyps were removed with a cold snare. Resection and retrieval were complete. - A diminutive polyp was found in the splenic flexure. The polyp was sessile. The polyp was removed with a cold snare. Resection and retrieval were complete. - Two sessile polyps were found in the descending colon. The polyps were 3 mm in size. These polyps were removed with a cold snare. Resection and retrieval were complete. - A diminutive polyp was found in the sigmoid colon. The polyp was sessile. The polyp was removed with a cold snare. Resection and retrieval were complete. - A few small-mouthed diverticula were found in the cecum and left colon. - Internal hemorrhoids were found during retroflexion. The hemorrhoids were  moderate. - The exam was otherwise without abnormality.   Surgical [P], colon, ascending, transverse, splenic flexure, descending, sigmoid, polyp (7) FINDINGS CONSISTENT WITH- TUBULAR ADENOMA X 7. NO DEFINITE HIGH GRADE DYSPLASIA OR MALIGNANCY IS SEEN. CLINICAL AND ENDOSCOPIC CORRELATION IS REQUIRED.   Repeat in 3 years   PROCEDURE NOTE: The patient presents with symptomatic grade II  hemorrhoids, requesting rubber band ligation of his/her hemorrhoidal disease.  All risks, benefits and alternative forms of therapy were described and informed consent was obtained.   The anorectum was pre-medicated with 0.125% nitroglycerin The decision was made to band the internal hemorrhoid at the RP area, with the [facility] hem banding system that was used today to perform band ligation without complication.  Digital anorectal examination was then performed to assure proper positioning of the band, and to adjust the banded tissue as required.  The patient was discharged home without pain or other issues.  Dietary and behavioral recommendations were given and along with follow-up instructions.     The following adjunctive treatments were recommended: Continue Metamucil daily   The patient will return in 2-4 weeks for  follow-up and possible additional banding as required. No complications were encountered and the patient tolerated the procedure well.     Kevin Mango, MD [facility]

## 2022-03-15 NOTE — Patient Instructions (Signed)
If you are age 67 or older, your body mass index should be between 23-30. Your Body mass index is 29.57 kg/m. If this is out of the aforementioned range listed, please consider follow up with your Primary Care Provider.  If you are age 5 or younger, your body mass index should be between 19-25. Your Body mass index is 29.57 kg/m. If this is out of the aformentioned range listed, please consider follow up with your Primary Care Provider.   ________________________________________________________  Kevin Mckinney have been scheduled for your 3rd and final hemorrhoid banding appointment on Tuesday, 10-24 at 4:00 pm. If you need to reschedule this appointment please call as soon as possible: [phone].    HEMORRHOID BANDING PROCEDURE    FOLLOW-UP CARE   The procedure you have had should have been relatively painless since the banding of the area involved does not have nerve endings and there is no pain sensation.  The rubber band cuts off the blood supply to the hemorrhoid and the band may fall off as soon as 48 hours after the banding (the band may occasionally be seen in the toilet bowl following a bowel movement). You may notice a temporary feeling of fullness in the rectum which should respond adequately to plain Tylenol or Motrin.  Following the banding, avoid strenuous exercise that evening and resume full activity the next day.  A sitz bath (soaking in a warm tub) or bidet is soothing, and can be useful for cleansing the area after bowel movements.     To avoid constipation, take two tablespoons of natural wheat bran, natural oat bran, flax, Benefiber or any over the counter fiber supplement and increase your water intake to 7-8 glasses daily.    Unless you have been prescribed anorectal medication, do not put anything inside your rectum for two weeks: No suppositories, enemas, fingers, etc.  Occasionally, you may have more bleeding than usual after the banding procedure.  This is often from  the untreated hemorrhoids rather than the treated one.  Don't be concerned if there is a tablespoon or so of blood.  If there is more blood than this, lie flat with your bottom higher than your head and apply an ice pack to the area. If the bleeding does not stop within a half an hour or if you feel faint, call our office at [phone] or go to the emergency room.  Problems are not common; however, if there is a substantial amount of bleeding, severe pain, chills, fever or difficulty passing urine (very rare) or other problems, you should call us at [phone] or report to the nearest emergency room.  Do not stay seated continuously for more than 2-3 hours for a day or two after the procedure.  Tighten your buttock muscles 10-15 times every two hours and take 10-15 deep breaths every 1-2 hours.  Do not spend more than a few minutes on the toilet if you cannot empty your bowel; instead re-visit the toilet at a later time.   Thank you for entrusting me with your care and for choosing [facility], Dr. Moorhead Cellar

## 2022-04-12 ENCOUNTER — Encounter: Payer: Medicare Other | Admitting: Gastroenterology

## 2022-04-26 ENCOUNTER — Encounter: Payer: Self-pay | Admitting: Nurse Practitioner

## 2022-04-26 ENCOUNTER — Ambulatory Visit (INDEPENDENT_AMBULATORY_CARE_PROVIDER_SITE_OTHER): Payer: Medicare Other | Admitting: Nurse Practitioner

## 2022-04-26 VITALS — BP 142/72 | HR 81 | Temp 98.1°F | Ht 72.0 in | Wt 220.0 lb

## 2022-04-26 DIAGNOSIS — R030 Elevated blood-pressure reading, without diagnosis of hypertension: Secondary | ICD-10-CM

## 2022-04-26 DIAGNOSIS — K029 Dental caries, unspecified: Secondary | ICD-10-CM

## 2022-04-26 DIAGNOSIS — N529 Male erectile dysfunction, unspecified: Secondary | ICD-10-CM

## 2022-04-26 DIAGNOSIS — H539 Unspecified visual disturbance: Secondary | ICD-10-CM | POA: Diagnosis not present

## 2022-04-26 MED ORDER — AMLODIPINE BESYLATE 2.5 MG PO TABS: 2.5000 mg | ORAL_TABLET | Freq: Every day | ORAL | 11 refills | Status: DC

## 2022-04-26 NOTE — Progress Notes (Signed)
Barnet Glasgow Martin,acting as a Education administrator for [name], FNP.,have documented all relevant documentation on the behalf of [name], FNP,as directed by  [name], FNP while in the presence of [name], [place].    Subjective:     Patient ID: Kevin Mckinney , male    DOB: 1955-11-06 , 66 y.o.   MRN: [id number]   Chief Complaint  Patient presents with   Hypertension    HPI  Patient presents today for a BP check, patient is currently not on any bp medicine. Patient states feel yuck so he related it to his bp being high. When he bends his head over and sit back up had "fogginess". He has taken medications for his blood pressure in the past. He did not take the medication due to side effects. He does eat out a good bit. Denies eating high salt foods. He does eat fried foods.   BP Readings from Last 3 Encounters: 04/26/22 : (!) 140/72 03/15/22 : (!) 140/80 01/31/22 : (!) 130/90    Hypertension This is a new problem. Past treatments include nothing. Compliance problems include exercise.      Past Medical History:  Diagnosis Date   Anxiety    Depression    Diverticulitis    GERD (gastroesophageal reflux disease)    hx of   Hemorrhoids      Family History  Problem Relation Age of Onset   Throat cancer Father        smoker/drinker   Colon polyps Neg Hx    Colon cancer Neg Hx    Esophageal cancer Neg Hx    Rectal cancer Neg Hx    Stomach cancer Neg Hx      Current Outpatient Medications:    acetaminophen (TYLENOL) 500 MG tablet, Take 2 tablets (1,000 mg total) by mouth every 6 (six) hours as needed for mild pain, fever or headache., Disp: 30 tablet, Rfl: 0   amLODipine (NORVASC) 2.5 MG tablet, Take 1 tablet (2.5 mg total) by mouth daily., Disp: 30 tablet, Rfl: 11   cyclobenzaprine (FLEXERIL) 10 MG tablet, Take 1 tablet (10 mg total) by mouth 3 (three) times daily as needed for muscle spasms., Disp: 30 tablet, Rfl: 0   ibuprofen (ADVIL) 200 MG tablet, Take 400 mg by  mouth at bedtime as needed for moderate pain., Disp: , Rfl:    meloxicam (MOBIC) 7.5 MG tablet, Take 1 tablet by mouth daily x 5 days then daily as needed, Disp: 30 tablet, Rfl: 2   psyllium (METAMUCIL SMOOTH TEXTURE) 58.6 % powder, Take 1 packet by mouth 3 (three) times daily. (Patient taking differently: Take 1 packet by mouth daily.), Disp: 283 g, Rfl: 12   sildenafil (VIAGRA) 100 MG tablet, Take 0.5 tablets (50 mg total) by mouth daily as needed for erectile dysfunction. (Patient taking differently: Take 50 mg by mouth as needed for erectile dysfunction.), Disp: 20 tablet, Rfl: 5   No Known Allergies   Review of Systems  Constitutional: Negative.   HENT: Negative.    Eyes: Negative.   Respiratory: Negative.    Cardiovascular: Negative.   Gastrointestinal: Negative.      Today's Vitals   04/26/22 1157 04/26/22 1222  BP: (!) 140/72 (!) 142/72  Pulse: 81   Temp: 98.1 F (36.7 C)   TempSrc: Oral   Weight: 220 lb (99.8 kg)   Height: 6' (1.829 m)   PainSc: 0-No pain    Body mass index is 29.84 kg/m.  Wt Readings from  Last 3 Encounters:  04/26/22 220 lb (99.8 kg)  03/15/22 218 lb (98.9 kg)  01/31/22 213 lb 3.2 oz (96.7 kg)    Objective:  Physical Exam Vitals reviewed.  Constitutional:      General: He is not in acute distress.    Appearance: Normal appearance.  HENT:     Mouth/Throat:     Dentition: Dental caries (left upper molar) present.  Cardiovascular:     Rate and Rhythm: Normal rate and regular rhythm.     Pulses: Normal pulses.     Heart sounds: Normal heart sounds. No murmur heard. Pulmonary:     Effort: Pulmonary effort is normal. No respiratory distress.     Breath sounds: Normal breath sounds. No wheezing.  Skin:    General: Skin is warm and dry.     Capillary Refill: Capillary refill takes less than 2 seconds.  Neurological:     General: No focal deficit present.     Mental Status: He is alert and oriented to person, place, and time.     Cranial  Nerves: No cranial nerve deficit.     Motor: No weakness.         Assessment And Plan:     1. Elevated BP without diagnosis of hypertension - BMP8+eGFR - amLODipine (NORVASC) 2.5 MG tablet; Take 1 tablet (2.5 mg total) by mouth daily.  Dispense: 30 tablet; Refill: 11 - EKG 12-Lead  2. Erectile dysfunction, unspecified erectile dysfunction type - Ambulatory referral to Urology  3. Vision changes - Ambulatory referral to Ophthalmology  4. Dental caries - Ambulatory referral to Dentistry     Patient was given opportunity to ask questions. Patient verbalized understanding of the plan and was able to repeat key elements of the plan. All questions were answered to their satisfaction.  [name], FNP   I, [name], FNP, have reviewed all documentation for this visit. The documentation on 04/26/22 for the exam, diagnosis, procedures, and orders are all accurate and complete.   IF YOU HAVE BEEN REFERRED TO A SPECIALIST, IT MAY TAKE 1-2 WEEKS TO SCHEDULE/PROCESS THE REFERRAL. IF YOU HAVE NOT HEARD FROM US/SPECIALIST IN TWO WEEKS, PLEASE GIVE [place] A CALL AT [phone].   THE PATIENT IS ENCOURAGED TO PRACTICE SOCIAL DISTANCING DUE TO THE COVID-19 PANDEMIC.

## 2022-04-26 NOTE — Progress Notes (Deleted)
Patient presents today for a BP check, patient is currently not taking any BP medication.  BP Readings from Last 3 Encounters:  04/26/22 (!) 140/72  03/15/22 (!) 140/80  01/31/22 (!) 130/90

## 2022-04-26 NOTE — Patient Instructions (Addendum)
Hypertension, Adult Hypertension is another name for high blood pressure. High blood pressure forces your heart to work harder to pump blood. This can cause problems over time. There are two numbers in a blood pressure reading. There is a top number (systolic) over a bottom number (diastolic). It is best to have a blood pressure that is below 120/80. What are the causes? The cause of this condition is not known. Some other conditions can lead to high blood pressure. What increases the risk? Some lifestyle factors can make you more likely to develop high blood pressure: Smoking. Not getting enough exercise or physical activity. Being overweight. Having too much fat, sugar, calories, or salt (sodium) in your diet. Drinking too much alcohol. Other risk factors include: Having any of these conditions: Heart disease. Diabetes. High cholesterol. Kidney disease. Obstructive sleep apnea. Having a family history of high blood pressure and high cholesterol. Age. The risk increases with age. Stress. What are the signs or symptoms? High blood pressure may not cause symptoms. Very high blood pressure (hypertensive crisis) may cause: Headache. Fast or uneven heartbeats (palpitations). Shortness of breath. Nosebleed. Vomiting or feeling like you may vomit (nauseous). Changes in how you see. Very bad chest pain. Feeling dizzy. Seizures. How is this treated? This condition is treated by making healthy lifestyle changes, such as: Eating healthy foods. Exercising more. Drinking less alcohol. Your doctor may prescribe medicine if lifestyle changes do not help enough and if: Your top number is above 130. Your bottom number is above 80. Your personal target blood pressure may vary. Follow these instructions at home: Eating and drinking  If told, follow the DASH eating plan. To follow this plan: Fill one half of your plate at each meal with fruits and vegetables. Fill one fourth of your plate  at each meal with whole grains. Whole grains include whole-wheat pasta, brown rice, and whole-grain bread. Eat or drink low-fat dairy products, such as skim milk or low-fat yogurt. Fill one fourth of your plate at each meal with low-fat (lean) proteins. Low-fat proteins include fish, chicken without skin, eggs, beans, and tofu. Avoid fatty meat, cured and processed meat, or chicken with skin. Avoid pre-made or processed food. Limit the amount of salt in your diet to less than 1,500 mg each day. Do not drink alcohol if: Your doctor tells you not to drink. You are pregnant, may be pregnant, or are planning to become pregnant. If you drink alcohol: Limit how much you have to: 0-1 drink a day for women. 0-2 drinks a day for men. Know how much alcohol is in your drink. In the [place] one drink equals one 12 oz bottle of beer (355 mL), one 5 oz glass of wine (148 mL), or one 1 oz glass of hard liquor (44 mL). Lifestyle  Work with your doctor to stay at a healthy weight or to lose weight. Ask your doctor what the best weight is for you. Get at least 30 minutes of exercise that causes your heart to beat faster (aerobic exercise) most days of the week. This may include walking, swimming, or biking. Get at least 30 minutes of exercise that strengthens your muscles (resistance exercise) at least 3 days a week. This may include lifting weights or doing Pilates. Do not smoke or use any products that contain nicotine or tobacco. If you need help quitting, ask your doctor. Check your blood pressure at home as told by your doctor. Keep all follow-up visits. Medicines Take over-the-counter and prescription medicines  only as told by your doctor. Follow directions carefully. Do not skip doses of blood pressure medicine. The medicine does not work as well if you skip doses. Skipping doses also puts you at risk for problems. Ask your doctor about side effects or reactions to medicines that you should watch  for. Contact a doctor if: You think you are having a reaction to the medicine you are taking. You have headaches that keep coming back. You feel dizzy. You have swelling in your ankles. You have trouble with your vision. Get help right away if: You get a very bad headache. You start to feel mixed up (confused). You feel weak or numb. You feel faint. You have very bad pain in your: Chest. Belly (abdomen). You vomit more than once. You have trouble breathing. These symptoms may be an emergency. Get help right away. Call 911. Do not wait to see if the symptoms will go away. Do not drive yourself to the hospital. Summary Hypertension is another name for high blood pressure. High blood pressure forces your heart to work harder to pump blood. For most people, a normal blood pressure is less than 120/80. Making healthy choices can help lower blood pressure. If your blood pressure does not get lower with healthy choices, you may need to take medicine. This information is not intended to replace advice given to you by your health care provider. Make sure you discuss any questions you have with your health care provider. Document Revised: 03/25/2021 Document Reviewed: 03/25/2021 Elsevier Patient Education  [place].   Low-Sodium Eating Plan Sodium, which is an element that makes up salt, helps you maintain a healthy balance of fluids in your body. Too much sodium can increase your blood pressure and cause fluid and waste to be held in your body. Your health care provider or dietitian may recommend following this plan if you have high blood pressure (hypertension), kidney disease, liver disease, or heart failure. Eating less sodium can help lower your blood pressure, reduce swelling, and protect your heart, liver, and kidneys. What are tips for following this plan? Reading food labels The Nutrition Facts label lists the amount of sodium in one serving of the food. If you eat more  than one serving, you must multiply the listed amount of sodium by the number of servings. Choose foods with less than 140 mg of sodium per serving. Avoid foods with 300 mg of sodium or more per serving. Shopping  Look for lower-sodium products, often labeled as "low-sodium" or "no salt added." Always check the sodium content, even if foods are labeled as "unsalted" or "no salt added." Buy fresh foods. Avoid canned foods and pre-made or frozen meals. Avoid canned, cured, or processed meats. Buy breads that have less than 80 mg of sodium per slice. Cooking  Eat more home-cooked food and less restaurant, buffet, and fast food. Avoid adding salt when cooking. Use salt-free seasonings or herbs instead of table salt or sea salt. Check with your health care provider or pharmacist before using salt substitutes. Cook with plant-based oils, such as canola, sunflower, or olive oil. Meal planning When eating at a restaurant, ask that your food be prepared with less salt or no salt, if possible. Avoid dishes labeled as brined, pickled, cured, smoked, or made with soy sauce, miso, or teriyaki sauce. Avoid foods that contain MSG (monosodium glutamate). MSG is sometimes added to [place] food, bouillon, and some canned foods. Make meals that can be grilled, baked, poached, roasted, or  steamed. These are generally made with less sodium. General information Most people on this plan should limit their sodium intake to 1,500-2,000 mg (milligrams) of sodium each day. What foods should I eat? Fruits Fresh, frozen, or canned fruit. Fruit juice. Vegetables Fresh or frozen vegetables. "No salt added" canned vegetables. "No salt added" tomato sauce and paste. Low-sodium or reduced-sodium tomato and vegetable juice. Grains Low-sodium cereals, including oats, puffed wheat and rice, and shredded wheat. Low-sodium crackers. Unsalted rice. Unsalted pasta. Low-sodium bread. Whole-grain breads and whole-grain  pasta. Meats and other proteins Fresh or frozen (no salt added) meat, poultry, seafood, and fish. Low-sodium canned tuna and salmon. Unsalted nuts. Dried peas, beans, and lentils without added salt. Unsalted canned beans. Eggs. Unsalted nut butters. Dairy Milk. Soy milk. Cheese that is naturally low in sodium, such as ricotta cheese, fresh mozzarella, or Swiss cheese. Low-sodium or reduced-sodium cheese. Cream cheese. Yogurt. Seasonings and condiments Fresh and dried herbs and spices. Salt-free seasonings. Low-sodium mustard and ketchup. Sodium-free salad dressing. Sodium-free light mayonnaise. Fresh or refrigerated horseradish. Lemon juice. Vinegar. Other foods Homemade, reduced-sodium, or low-sodium soups. Unsalted popcorn and pretzels. Low-salt or salt-free chips. The items listed above may not be a complete list of foods and beverages you can eat. Contact a dietitian for more information. What foods should I avoid? Vegetables Sauerkraut, pickled vegetables, and relishes. Olives. [place] fries. Onion rings. Regular canned vegetables (not low-sodium or reduced-sodium). Regular canned tomato sauce and paste (not low-sodium or reduced-sodium). Regular tomato and vegetable juice (not low-sodium or reduced-sodium). Frozen vegetables in sauces. Grains Instant hot cereals. Bread stuffing, pancake, and biscuit mixes. Croutons. Seasoned rice or pasta mixes. Noodle soup cups. Boxed or frozen macaroni and cheese. Regular salted crackers. Self-rising flour. Meats and other proteins Meat or fish that is salted, canned, smoked, spiced, or pickled. Precooked or cured meat, such as sausages or meat loaves. Bernie[place] Salines. Ham. Pepperoni. Hot dogs. Corned beef. Chipped beef. Salt pork. Jerky. Pickled herring. Anchovies and sardines. Regular canned tuna. Salted nuts. Dairy Processed cheese and cheese spreads. Hard cheeses. Cheese curds. Blue cheese. Feta cheese. String cheese. Regular cottage cheese. Buttermilk. Canned  milk. Fats and oils Salted butter. Regular margarine. Ghee. Bacon fat. Seasonings and condiments Onion salt, garlic salt, seasoned salt, table salt, and sea salt. Canned and packaged gravies. Worcestershire sauce. Tartar sauce. Barbecue sauce. Teriyaki sauce. Soy sauce, including reduced-sodium. Steak sauce. Fish sauce. Oyster sauce. Cocktail sauce. Horseradish that you find on the shelf. Regular ketchup and mustard. Meat flavorings and tenderizers. Bouillon cubes. Hot sauce. Pre-made or packaged marinades. Pre-made or packaged taco seasonings. Relishes. Regular salad dressings. Salsa. Other foods Salted popcorn and pretzels. Corn chips and puffs. Potato and tortilla chips. Canned or dried soups. Pizza. Frozen entrees and pot pies. The items listed above may not be a complete list of foods and beverages you should avoid. Contact a dietitian for more information. Summary Eating less sodium can help lower your blood pressure, reduce swelling, and protect your heart, liver, and kidneys. Most people on this plan should limit their sodium intake to 1,500-2,000 mg (milligrams) of sodium each day. Canned, boxed, and frozen foods are high in sodium. Restaurant foods, fast foods, and pizza are also very high in sodium. You also get sodium by adding salt to food. Try to cook at home, eat more fresh fruits and vegetables, and eat less fast food and canned, processed, or prepared foods. This information is not intended to replace advice given to you by your health care provider.  Make sure you discuss any questions you have with your health care provider. Document Revised: 07/12/2019 Document Reviewed: 05/08/2019 Elsevier Patient Education  [place].  Address: [address] [place], [place] [address] Hours:  Open ? Closes 5?PM Phone: [phone] Suggest an edit

## 2022-04-27 LAB — BMP8+EGFR
BUN/Creatinine Ratio: 13 (ref 10–24)
BUN: 16 mg/dL (ref 8–27)
CO2: 24 mmol/L (ref 20–29)
Calcium: 9.3 mg/dL (ref 8.6–10.2)
Chloride: 105 mmol/L (ref 96–106)
Creatinine, Ser: 1.26 mg/dL (ref 0.76–1.27)
Glucose: 88 mg/dL (ref 70–99)
Potassium: 4.3 mmol/L (ref 3.5–5.2)
Sodium: 141 mmol/L (ref 134–144)
eGFR: 63 mL/min/{1.73_m2} (ref >59)

## 2022-05-09 ENCOUNTER — Ambulatory Visit: Payer: Medicare Other | Admitting: Nurse Practitioner

## 2022-08-09 ENCOUNTER — Encounter: Payer: Self-pay | Admitting: Nurse Practitioner

## 2022-08-09 ENCOUNTER — Ambulatory Visit (INDEPENDENT_AMBULATORY_CARE_PROVIDER_SITE_OTHER): Payer: Medicare Other | Admitting: Nurse Practitioner

## 2022-08-09 VITALS — BP 128/82 | HR 61 | Temp 98.3°F | Ht 72.0 in | Wt 212.0 lb

## 2022-08-09 DIAGNOSIS — M79671 Pain in right foot: Secondary | ICD-10-CM

## 2022-08-09 DIAGNOSIS — I10 Essential (primary) hypertension: Secondary | ICD-10-CM | POA: Diagnosis not present

## 2022-08-09 DIAGNOSIS — G8929 Other chronic pain: Secondary | ICD-10-CM | POA: Diagnosis not present

## 2022-08-09 DIAGNOSIS — F3289 Other specified depressive episodes: Secondary | ICD-10-CM

## 2022-08-09 DIAGNOSIS — M545 Low back pain, unspecified: Secondary | ICD-10-CM

## 2022-08-09 DIAGNOSIS — D229 Melanocytic nevi, unspecified: Secondary | ICD-10-CM

## 2022-08-09 DIAGNOSIS — M79672 Pain in left foot: Secondary | ICD-10-CM

## 2022-08-09 NOTE — Patient Instructions (Addendum)

## 2022-08-09 NOTE — Progress Notes (Signed)
I,Sheena H Holbrook,acting as a Education administrator for [name], FNP.,have documented all relevant documentation on the behalf of [name], FNP,as directed by  [name], FNP while in the presence of [name], [place].    Subjective:     Patient ID: Kevin Mckinney , male    DOB: 10-02-1955 , 67 y.o.   MRN: [id number]   Chief Complaint  Patient presents with   Foot Pain    bilateral   Blood Pressure Check    Has not been taking meds   Back Pain    HPI  Patient presents today for follow up on blood pressure. His blood pressure was elevated the last visit and was started on amlodipine. He feels it is causing his feet to hurt over the last 2 weeks. Patient states he started Amlodipine but it made his feet hurt, so he stopped taking it several weeks ago; patient reports pain is still present. Patient does not take his BP at home. Patient also reports LBP when he lays down in bed. Reports having the soreness worse at night. He is on his feet a lot throughout the day. He is also having pain to his back, reports he moves around a lot at night. He is only getting 3-4 hours sleep at night. He feels like a lot of it is stress related. He is having an overload with relationship (that has been off and on) - he has recently asked her to leave, financial. He has a continuous slump he is in. He feels like he is lacking will power.   Hypertension This is a chronic problem. The problem is controlled. Pertinent negatives include no anxiety. Risk factors for coronary artery disease include sedentary lifestyle. Past treatments include calcium channel blockers.     Past Medical History:  Diagnosis Date   Anxiety    Depression    Diverticulitis    GERD (gastroesophageal reflux disease)    hx of   Hemorrhoids      Family History  Problem Relation Age of Onset   Throat cancer Father        smoker/drinker   Colon polyps Neg Hx    Colon cancer Neg Hx    Esophageal cancer Neg Hx    Rectal cancer Neg  Hx    Stomach cancer Neg Hx      Current Outpatient Medications:    acetaminophen (TYLENOL) 500 MG tablet, Take 2 tablets (1,000 mg total) by mouth every 6 (six) hours as needed for mild pain, fever or headache., Disp: 30 tablet, Rfl: 0   cyclobenzaprine (FLEXERIL) 10 MG tablet, Take 1 tablet (10 mg total) by mouth 3 (three) times daily as needed for muscle spasms., Disp: 30 tablet, Rfl: 0   ibuprofen (ADVIL) 200 MG tablet, Take 400 mg by mouth at bedtime as needed for moderate pain., Disp: , Rfl:    meloxicam (MOBIC) 7.5 MG tablet, Take 1 tablet by mouth daily x 5 days then daily as needed, Disp: 30 tablet, Rfl: 2   psyllium (METAMUCIL SMOOTH TEXTURE) 58.6 % powder, Take 1 packet by mouth 3 (three) times daily. (Patient taking differently: Take 1 packet by mouth daily.), Disp: 283 g, Rfl: 12   sildenafil (VIAGRA) 100 MG tablet, Take 0.5 tablets (50 mg total) by mouth daily as needed for erectile dysfunction. (Patient taking differently: Take 50 mg by mouth as needed for erectile dysfunction.), Disp: 20 tablet, Rfl: 5   amLODipine (NORVASC) 2.5 MG tablet, Take 1 tablet (2.5 mg total)  by mouth daily. (Patient not taking: Reported on 08/09/2022), Disp: 30 tablet, Rfl: 11   No Known Allergies   Review of Systems  Constitutional: Negative.   HENT: Negative.    Eyes: Negative.   Respiratory: Negative.    Cardiovascular: Negative.   Gastrointestinal: Negative.   Neurological: Negative.   Psychiatric/Behavioral: Negative.    All other systems reviewed and are negative.    Today's Vitals   08/09/22 1156  BP: 128/82  Pulse: 61  Temp: 98.3 F (36.8 C)  TempSrc: Oral  SpO2: 97%  Weight: 212 lb (96.2 kg)  Height: 6' (1.829 m)   Body mass index is 28.75 kg/m.   Objective:  Physical Exam Vitals reviewed.  Constitutional:      General: He is not in acute distress.    Appearance: Normal appearance.  Cardiovascular:     Rate and Rhythm: Normal rate and regular rhythm.     Pulses:  Normal pulses.     Heart sounds: Normal heart sounds. No murmur heard. Pulmonary:     Effort: Pulmonary effort is normal. No respiratory distress.     Breath sounds: Normal breath sounds. No wheezing.  Musculoskeletal:        General: No swelling or tenderness. Normal range of motion.  Skin:    General: Skin is warm and dry.     Capillary Refill: Capillary refill takes less than 2 seconds.     Findings: Lesion present.  Neurological:     General: No focal deficit present.     Mental Status: He is alert and oriented to person, place, and time.     Cranial Nerves: No cranial nerve deficit.     Motor: No weakness.  Psychiatric:        Mood and Affect: Mood normal.        Behavior: Behavior normal.        Thought Content: Thought content normal.        Judgment: Judgment normal.         Assessment And Plan:     1. Essential hypertension Comments: Blood pressure is fairly controlled.  Discussed importance of adherence to medications.  Advised to restart to keep a better control of blood pressure.  2. Other depression Comments: He is ready to go to counseling will refer to counselor.  Feels like it is starting to affect his daily lifestyle - Ambulatory referral to Psychology  3. Chronic midline low back pain without sciatica - DG Lumbar Spine Complete; Future  4. Bilateral foot pain Comments: I do not feel like his foot pain is related to the amlodipine.  He does not have a swelling to his lower extremities. - DG Foot Complete Left; Future - DG Foot Complete Right; Future  5. Skin mole - Ambulatory referral to Dermatology     Patient was given opportunity to ask questions. Patient verbalized understanding of the plan and was able to repeat key elements of the plan. All questions were answered to their satisfaction.  [name], FNP   I, [name], FNP, have reviewed all documentation for this visit. The documentation on 08/09/22 for the exam, diagnosis, procedures,  and orders are all accurate and complete.   IF YOU HAVE BEEN REFERRED TO A SPECIALIST, IT MAY TAKE 1-2 WEEKS TO SCHEDULE/PROCESS THE REFERRAL. IF YOU HAVE NOT HEARD FROM US/SPECIALIST IN TWO WEEKS, PLEASE GIVE [place] A CALL AT [phone].   THE PATIENT IS ENCOURAGED TO PRACTICE SOCIAL DISTANCING DUE TO THE COVID-19 PANDEMIC.

## 2022-08-16 DIAGNOSIS — I10 Essential (primary) hypertension: Secondary | ICD-10-CM | POA: Insufficient documentation

## 2022-08-16 DIAGNOSIS — M79672 Pain in left foot: Secondary | ICD-10-CM | POA: Insufficient documentation

## 2022-08-16 DIAGNOSIS — F32A Depression, unspecified: Secondary | ICD-10-CM | POA: Insufficient documentation

## 2022-09-08 ENCOUNTER — Encounter: Payer: Self-pay | Admitting: Dermatology

## 2022-09-08 ENCOUNTER — Ambulatory Visit (INDEPENDENT_AMBULATORY_CARE_PROVIDER_SITE_OTHER): Payer: Medicare Other | Admitting: Dermatology

## 2022-09-08 DIAGNOSIS — L821 Other seborrheic keratosis: Secondary | ICD-10-CM

## 2022-09-08 DIAGNOSIS — L908 Other atrophic disorders of skin: Secondary | ICD-10-CM | POA: Diagnosis not present

## 2022-09-08 DIAGNOSIS — L820 Inflamed seborrheic keratosis: Secondary | ICD-10-CM | POA: Diagnosis not present

## 2022-09-08 MED ORDER — TRETINOIN 0.025 % EX CREA: TOPICAL_CREAM | Freq: Every day | CUTANEOUS | 0 refills | Status: AC

## 2022-09-08 NOTE — Progress Notes (Signed)
   New Patient Visit  Subjective  Kevin Mckinney is a 67 y.o. male who presents for the following: Other (Patient complains of spots on the face. Has concerns with a birthmark on his face. ).    Objective  Well appearing patient in no apparent distress; mood and affect are within normal limits.  A focused examination was performed including face. Relevant physical exam findings are noted in the Assessment and Plan.  Head - Anterior (Face) Stuck-on verrucous, tan-brown papules and plaques.   Head - Anterior (Face) General aging spots on the face   Assessment & Plan  Seborrheic keratosis Head - Anterior (Face)  I counseled the patient regarding the following: Skin Care: Seborrheic Keratoses are benign. No treatment is necessary. Expectations: Seborrheic Keratoses are benign warty growths. Patients get more of them as they age.   Skin aging Head - Anterior (Face)  Prescribed Tretinoin 0.025% to use a pea sized amount three nights a week  tretinoin (RETIN-A) 0.025 % cream - Head - Anterior (Face) Apply topically at bedtime. Apply a pea sized amount to the face three nights a week   Return in about 4 months (around 01/08/2023) for Skin check.   Documentation: I have reviewed the above documentation for accuracy and completeness, and I agree with the above  [place], DO

## 2022-09-08 NOTE — Patient Instructions (Addendum)
Daily Skincare Regimen: Patient Handout  Morning Routine:  Cleanse: Start your day by washing your face with a gentle cleanser. Choose a product suitable for your skin type, such as CeraVe, Neutrogena, Cetaphil, or LaRoche-Posay. Gently massage the cleanser onto damp skin, then rinse thoroughly with lukewarm water and pat dry with a clean towel.   Moisturize: Finish your morning routine by applying a moisturizer to your face and neck. Opt for a moisturizer that has SPF included, is suitable for your skin type and provides hydration without clogging pores. Consider brands like CeraVe, Neutrogena, Cetaphil, or LaRoche-Posay for effective hydration and skin barrier support.  Evening Routine:  Cleanse: Before bed, cleanse your face again with a gentle cleanser to remove makeup, dirt, and impurities accumulated throughout the day. Use the same cleanser as in the morning and follow the same steps for cleansing.  Apply Medication: Ensure that your skin is completely dry before applying any topical treatments to maximize their efficacy.  Apply prescription medication TRETINOIN 0.025%   Moisturize: After applying any medications, moisturize your skin to seal in hydration and support skin barrier function. Choose a moisturizer suitable for nighttime use that helps replenish moisture overnight. Look for products from trusted brands like CeraVe, Neutrogena, Cetaphil, or LaRoche-Posay for optimal results.   Additional Tips:  Sun Protection: During the daytime, it is essential to apply a broad-spectrum sunscreen with an SPF of 30 or higher to protect your skin from harmful UV rays. Apply sunscreen as the last step of your morning skincare routine and reapply throughout the day as needed, especially if you will be spending time outdoors.  Hydration: Drink plenty of water throughout the day to keep your skin hydrated from within.  Healthy  Lifestyle: Maintain a balanced diet, get regular exercise, manage stress, and prioritize adequate sleep to support overall skin health.   Following a consistent daily skincare regimen can help promote healthy, radiant skin and minimize the risk of common skin issues. Be patient and consistent with your routine, and remember to listen to your skin's needs    Due to recent changes in healthcare laws, you may see results of your pathology and/or laboratory studies on MyChart before the doctors have had a chance to review them. We understand that in some cases there may be results that are confusing or concerning to you. Please understand that not all results are received at the same time and often the doctors may need to interpret multiple results in order to provide you with the best plan of care or course of treatment. Therefore, we ask that you please give [place] 2 business days to thoroughly review all your results before contacting the office for clarification. Should we see a critical lab result, you will be contacted sooner.   If You Need Anything After Your Visit  If you have any questions or concerns for your doctor, please call our main line at [phone] If no one answers, please leave a voicemail as directed and we will return your call as soon as possible. Messages left after 4 pm will be answered the following business day.   You may also send [place] a message via [place]. We typically respond to MyChart messages within 1-2 business days.  For prescription refills, please ask your pharmacy to contact our office. Our fax number is [phone].  If you have an urgent issue when the clinic is closed that cannot wait until the next business day, you can page your doctor at the number below.  Please note that while we do our best to be available for urgent issues outside of office hours, we are not available 24/7.   If you have an urgent issue and are unable to reach [place], you may choose to  seek medical care at your doctor's office, retail clinic, urgent care center, or emergency room.  If you have a medical emergency, please immediately call 911 or go to the emergency department. In the event of inclement weather, please call our main line at [phone] for an update on the status of any delays or closures.  Dermatology Medication Tips: Please keep the boxes that topical medications come in in order to help keep track of the instructions about where and how to use these. Pharmacies typically print the medication instructions only on the boxes and not directly on the medication tubes.   If your medication is too expensive, please contact our office at [phone] or send [place] a message through [place].   We are unable to tell what your co-pay for medications will be in advance as this is different depending on your insurance coverage. However, we may be able to find a substitute medication at lower cost or fill out paperwork to get insurance to cover a needed medication.   If a prior authorization is required to get your medication covered by your insurance company, please allow [place] 1-2 business days to complete this process.  Drug prices often vary depending on where the prescription is filled and some pharmacies may offer cheaper prices.  The website www.goodrx.com contains coupons for medications through different pharmacies. The prices here do not account for what the cost may be with help from insurance (it may be cheaper with your insurance), but the website can give you the price if you did not use any insurance.  - You can print the associated coupon and take it with your prescription to the pharmacy.  - You may also stop by our office during regular business hours and pick up a GoodRx coupon card.  - If you need your prescription sent electronically to a different pharmacy, notify our office through [facility] or by phone at [phone]

## 2022-10-24 ENCOUNTER — Encounter: Payer: Medicare Other | Admitting: Nurse Practitioner

## 2022-11-03 ENCOUNTER — Encounter: Payer: Medicare Other | Admitting: Nurse Practitioner

## 2022-11-17 ENCOUNTER — Encounter: Payer: Self-pay | Admitting: Nurse Practitioner

## 2022-11-17 ENCOUNTER — Ambulatory Visit (INDEPENDENT_AMBULATORY_CARE_PROVIDER_SITE_OTHER): Payer: Medicare Other | Admitting: Nurse Practitioner

## 2022-11-17 VITALS — BP 140/60 | HR 98 | Temp 98.0°F | Ht 72.0 in | Wt 215.6 lb

## 2022-11-17 DIAGNOSIS — M79671 Pain in right foot: Secondary | ICD-10-CM | POA: Diagnosis not present

## 2022-11-17 DIAGNOSIS — M79672 Pain in left foot: Secondary | ICD-10-CM

## 2022-11-17 DIAGNOSIS — Z79899 Other long term (current) drug therapy: Secondary | ICD-10-CM

## 2022-11-17 DIAGNOSIS — R1032 Left lower quadrant pain: Secondary | ICD-10-CM | POA: Diagnosis not present

## 2022-11-17 DIAGNOSIS — Z13228 Encounter for screening for other metabolic disorders: Secondary | ICD-10-CM

## 2022-11-17 DIAGNOSIS — Z2821 Immunization not carried out because of patient refusal: Secondary | ICD-10-CM

## 2022-11-17 DIAGNOSIS — Z0000 Encounter for general adult medical examination without abnormal findings: Secondary | ICD-10-CM | POA: Diagnosis not present

## 2022-11-17 DIAGNOSIS — I10 Essential (primary) hypertension: Secondary | ICD-10-CM

## 2022-11-17 DIAGNOSIS — F3289 Other specified depressive episodes: Secondary | ICD-10-CM

## 2022-11-17 DIAGNOSIS — Z13220 Encounter for screening for lipoid disorders: Secondary | ICD-10-CM

## 2022-11-17 DIAGNOSIS — Z5320 Procedure and treatment not carried out because of patient's decision for unspecified reasons: Secondary | ICD-10-CM

## 2022-11-17 LAB — CBC WITH DIFFERENTIAL/PLATELET
Basophils Absolute: 0.1 10*3/uL (ref 0.0–0.2)
Basos: 1 %
EOS (ABSOLUTE): 0.1 10*3/uL (ref 0.0–0.4)
Eos: 2 %
Hematocrit: 40.8 % (ref 37.5–51.0)
Hemoglobin: 13.9 g/dL (ref 13.0–17.7)
Immature Grans (Abs): 0 10*3/uL (ref 0.0–0.1)
Immature Granulocytes: 0 %
Lymphocytes Absolute: 2.1 10*3/uL (ref 0.7–3.1)
Lymphs: 31 %
MCH: 29.8 pg (ref 26.6–33.0)
MCHC: 34.1 g/dL (ref 31.5–35.7)
MCV: 87 fL (ref 79–97)
Monocytes Absolute: 0.4 10*3/uL (ref 0.1–0.9)
Monocytes: 6 %
Neutrophils Absolute: 4.1 10*3/uL (ref 1.4–7.0)
Neutrophils: 60 %
Platelets: 229 10*3/uL (ref 150–450)
RBC: 4.67 x10E6/uL (ref 4.14–5.80)
RDW: 12.8 % (ref 11.6–15.4)
WBC: 6.7 10*3/uL (ref 3.4–10.8)

## 2022-11-17 LAB — LIPID PANEL
Chol/HDL Ratio: 3.4 ratio (ref 0.0–5.0)
Cholesterol, Total: 145 mg/dL (ref 100–199)
HDL: 43 mg/dL (ref >39)
LDL Chol Calc (NIH): 86 mg/dL (ref 0–99)
Triglycerides: 81 mg/dL (ref 0–149)
VLDL Cholesterol Cal: 16 mg/dL (ref 5–40)

## 2022-11-17 LAB — CMP14+EGFR
ALT: 19 IU/L (ref 0–44)
AST: 22 IU/L (ref 0–40)
Albumin/Globulin Ratio: 1.5 (ref 1.2–2.2)
Albumin: 4.1 g/dL (ref 3.9–4.9)
Alkaline Phosphatase: 89 IU/L (ref 44–121)
BUN/Creatinine Ratio: 11 (ref 10–24)
BUN: 14 mg/dL (ref 8–27)
Bilirubin Total: 0.5 mg/dL (ref 0.0–1.2)
CO2: 24 mmol/L (ref 20–29)
Calcium: 9 mg/dL (ref 8.6–10.2)
Chloride: 105 mmol/L (ref 96–106)
Creatinine, Ser: 1.32 mg/dL — ABNORMAL HIGH (ref 0.76–1.27)
Globulin, Total: 2.7 g/dL (ref 1.5–4.5)
Glucose: 83 mg/dL (ref 70–99)
Potassium: 4.2 mmol/L (ref 3.5–5.2)
Sodium: 142 mmol/L (ref 134–144)
Total Protein: 6.8 g/dL (ref 6.0–8.5)
eGFR: 59 mL/min/{1.73_m2} — ABNORMAL LOW (ref >59)

## 2022-11-17 LAB — POCT URINALYSIS DIP (CLINITEK)
Bilirubin, UA: NEGATIVE
Blood, UA: NEGATIVE
Glucose, UA: NEGATIVE mg/dL
Ketones, POC UA: NEGATIVE mg/dL
Leukocytes, UA: NEGATIVE
Nitrite, UA: NEGATIVE
POC PROTEIN,UA: NEGATIVE
Spec Grav, UA: >=1.03 — AB (ref 1.010–1.025)
Urobilinogen, UA: 0.2 E.U./dL
pH, UA: 6 (ref 5.0–8.0)

## 2022-11-17 LAB — HEMOGLOBIN A1C
Est. average glucose Bld gHb Est-mCnc: 123 mg/dL
Hgb A1c MFr Bld: 5.9 % — ABNORMAL HIGH (ref 4.8–5.6)

## 2022-11-17 NOTE — Patient Instructions (Addendum)
Go to [address] to [facility] for your xray you do not need an appt.

## 2022-11-17 NOTE — Progress Notes (Signed)
Kevin Mckinney,acting as a Neurosurgeon for Kevin Felts, FNP.,have documented all relevant documentation on the behalf of Kevin Felts, FNP,as directed by  Kevin Felts, FNP while in the presence of Kevin Felts, FNP.   Subjective:     Patient ID: Kevin Mckinney , male    DOB: 01-14-1956 , 67 y.o.   MRN: [id number]   Chief Complaint  Patient presents with   Annual Exam    HPI  Patient presents today for HM, patient reports not taking any medications and has no other concerns today. He has done phone consults for counselor. He does not have anything set up for the future with the counselor.   Patient reports stomach pain for about 2 weeks after eating a BBQ sandwich, patient also reports having pain in the top of his feet.   He usually takes metamucil daily. He has also taken melatonin and is now having soreness to the lower part of his stomach. He takes it to keep regular.   BP Readings from Last 3 Encounters: 11/17/22 : (!) 140/60 08/09/22 : 128/82 04/26/22 : (!) 142/72       Past Medical History:  Diagnosis Date   Anxiety    Depression    Diverticulitis    GERD (gastroesophageal reflux disease)    hx of   Hemorrhoids      Family History  Problem Relation Age of Onset   Throat cancer Father        smoker/drinker   Colon polyps Neg Hx    Colon cancer Neg Hx    Esophageal cancer Neg Hx    Rectal cancer Neg Hx    Stomach cancer Neg Hx      Current Outpatient Medications:    psyllium (METAMUCIL SMOOTH TEXTURE) 58.6 % powder, Take 1 packet by mouth 3 (three) times daily. (Patient taking differently: Take 1 packet by mouth daily.), Disp: 283 g, Rfl: 12   acetaminophen (TYLENOL) 500 MG tablet, Take 2 tablets (1,000 mg total) by mouth every 6 (six) hours as needed for mild pain, fever or headache. (Patient not taking: Reported on 11/17/2022), Disp: 30 tablet, Rfl: 0   amLODipine (NORVASC) 2.5 MG tablet, Take 1 tablet (2.5 mg total) by mouth daily. (Patient not taking:  Reported on 08/09/2022), Disp: 30 tablet, Rfl: 11   cyclobenzaprine (FLEXERIL) 10 MG tablet, Take 1 tablet (10 mg total) by mouth 3 (three) times daily as needed for muscle spasms. (Patient not taking: Reported on 11/17/2022), Disp: 30 tablet, Rfl: 0   ibuprofen (ADVIL) 200 MG tablet, Take 400 mg by mouth at bedtime as needed for moderate pain. (Patient not taking: Reported on 11/17/2022), Disp: , Rfl:    meloxicam (MOBIC) 7.5 MG tablet, Take 1 tablet by mouth daily x 5 days then daily as needed (Patient not taking: Reported on 11/17/2022), Disp: 30 tablet, Rfl: 2   sildenafil (VIAGRA) 100 MG tablet, Take 0.5 tablets (50 mg total) by mouth daily as needed for erectile dysfunction. (Patient not taking: Reported on 11/17/2022), Disp: 20 tablet, Rfl: 5   tretinoin (RETIN-A) 0.025 % cream, Apply topically at bedtime. Apply a pea sized amount to the face three nights a week (Patient not taking: Reported on 11/17/2022), Disp: 45 g, Rfl: 0   No Known Allergies   Men's preventive visit. Patient Health Questionnaire (PHQ-2) is  Flowsheet Row Office Visit from 11/17/2022 in [facility]  PHQ-2 Total Score 3     Patient is on a Regular diet  admits to eating out every day did eat at [facility] for a real meal, he does not cook. Exercise: none. Marital status: Single. Relevant history for alcohol use is:  Social History   Substance and Sexual Activity  Alcohol Use Not Currently   Alcohol/week: 0.0 - 1.0 standard drinks of alcohol   Comment: occ   Relevant history for tobacco use is:  Social History   Tobacco Use  Smoking Status Never  Smokeless Tobacco Never  .   Review of Systems  Constitutional: Negative.   HENT: Negative.    Eyes: Negative.   Respiratory: Negative.    Cardiovascular: Negative.   Gastrointestinal:  Positive for abdominal pain.  Endocrine: Negative.   Genitourinary: Negative.   Musculoskeletal: Negative.   Skin: Negative.   Allergic/Immunologic:  Negative.   Hematological: Negative.   Psychiatric/Behavioral: Negative.       Today's Vitals   11/17/22 0935  BP: (!) 140/60  Pulse: 98  Temp: 98 F (36.7 C)  TempSrc: Oral  Weight: 215 lb 9.6 oz (97.8 kg)  Height: 6' (1.829 m)  PainSc: 0-No pain   Body mass index is 29.24 kg/m.  Wt Readings from Last 3 Encounters:  11/23/22 215 lb (97.5 kg)  11/17/22 215 lb 9.6 oz (97.8 kg)  08/09/22 212 lb (96.2 kg)     Objective:  Physical Exam Vitals reviewed.  Constitutional:      General: He is not in acute distress.    Appearance: Normal appearance. He is obese.  HENT:     Head: Normocephalic and atraumatic.     Right Ear: Tympanic membrane, ear canal and external ear normal. There is no impacted cerumen.     Left Ear: Tympanic membrane, ear canal and external ear normal. There is no impacted cerumen.     Nose: Nose normal.     Mouth/Throat:     Mouth: Mucous membranes are moist.  Eyes:     Pupils: Pupils are equal, round, and reactive to light.  Cardiovascular:     Rate and Rhythm: Normal rate and regular rhythm.     Pulses: Normal pulses.     Heart sounds: Normal heart sounds. No murmur heard. Pulmonary:     Effort: Pulmonary effort is normal. No respiratory distress.     Breath sounds: Normal breath sounds.  Abdominal:     General: Abdomen is flat. Bowel sounds are normal. There is no distension.     Palpations: Abdomen is soft.     Tenderness: There is no abdominal tenderness. There is no guarding.  Genitourinary:    Prostate: Normal.     Rectum: Guaiac result negative.  Musculoskeletal:        General: Normal range of motion.     Cervical back: Normal range of motion and neck supple.  Skin:    General: Skin is warm.     Capillary Refill: Capillary refill takes less than 2 seconds.  Neurological:     General: No focal deficit present.     Mental Status: He is alert and oriented to person, place, and time.     Cranial Nerves: No cranial nerve deficit.      Motor: No weakness.  Psychiatric:        Mood and Affect: Mood normal.        Behavior: Behavior normal.        Thought Content: Thought content normal.        Judgment: Judgment normal.         Assessment  And Plan:    1. Encounter for annual health examination Behavior modifications discussed and diet history reviewed.   Pt will continue to exercise regularly and modify diet with low GI, plant based foods and decrease intake of processed foods.  Recommend intake of daily multivitamin, Vitamin D, and calcium.  Recommend colonoscopy for preventive screenings, as well as recommend immunizations that include influenza, TDAP, and Shingles Health Maintenance  Topic Date Due   COVID-19 Vaccine (1) 12/03/2022*   Zoster (Shingles) Vaccine (1 of 2) 02/17/2023*   Pneumonia Vaccine (1 of 1 - PCV) 11/17/2023*   Flu Shot  01/19/2023   Medicare Annual Wellness Visit  11/23/2023   DTaP/Tdap/Td vaccine (2 - Td or Tdap) 10/21/2031   Colon Cancer Screening  01/06/2032   Hepatitis C Screening  Completed   HPV Vaccine  Aged Out  *Topic was postponed. The date shown is not the original due date.   2. Encounter for screening for metabolic disorder - Hemoglobin A1c  3. Encounter for screening for lipid disorder - Lipid panel  4. Essential hypertension Comments: Blood pressure is elevated, he is not taking the medication. Discussed the risk of stroke or kidney damage with poorly controlled HTN. EKG: normal EKG, normal sinus rhythm, HR 65 nonspecific ST depression .Sinus Rhythm -occasional ectopic ventricular beat   - Microalbumin / creatinine urine ratio - POCT URINALYSIS DIP (CLINITEK) - EKG 12-Lead - CMP14+EGFR  5. Other depression Comments: He has done phone consultations with therapist but did not set up a f/u. Denies homicidal/suicidal ideation  6. Herpes zoster vaccination declined  7. COVID-19 vaccination declined  8. Patient declines to take medication Declines to take his  amlodipine, does not share a good reason discussed risk of not taking  9. Immunization declined Declines pneumonia vaccine  10. Left lower quadrant abdominal pain Comments: Likely related to the food he ate, having mild discomfort.  11. Bilateral foot pain Comments: Dorsal surface foot pain may be related to arthritis, no abnormal deformities noted on exam. Advised to f/u with Podiatry already established.  12. Other long term (current) drug therapy - CBC with Differential/Platelet    Return for 1 year physical. Patient was given opportunity to ask questions. Patient verbalized understanding of the plan and was able to repeat key elements of the plan. All questions were answered to their satisfaction.   Kevin Felts, FNP   I, Kevin Felts, FNP, have reviewed all documentation for this visit. The documentation on 11/17/22 for the exam, diagnosis, procedures, and orders are all accurate and complete.   THE PATIENT IS ENCOURAGED TO PRACTICE SOCIAL DISTANCING DUE TO THE COVID-19 PANDEMIC.

## 2022-11-18 LAB — MICROALBUMIN / CREATININE URINE RATIO
Creatinine, Urine: 154 mg/dL
Microalb/Creat Ratio: 4 mg/g creat (ref 0–29)
Microalbumin, Urine: 6 ug/mL

## 2022-11-23 ENCOUNTER — Ambulatory Visit (INDEPENDENT_AMBULATORY_CARE_PROVIDER_SITE_OTHER): Payer: Medicare Other

## 2022-11-23 VITALS — Ht 71.0 in | Wt 215.0 lb

## 2022-11-23 DIAGNOSIS — Z0000 Encounter for general adult medical examination without abnormal findings: Secondary | ICD-10-CM | POA: Diagnosis not present

## 2022-11-23 NOTE — Patient Instructions (Signed)
Kevin Mckinney , Thank you for taking time to come for your Medicare Wellness Visit. I appreciate your ongoing commitment to your health goals. Please review the following plan we discussed and let me know if I can assist you in the future.   These are the goals we discussed:  Goals      Patient Stated     11/17/2021, start exercising     Patient Stated     11/23/2022, working on six pack        This is a list of the screening recommended for you and due dates:  Health Maintenance  Topic Date Due   COVID-19 Vaccine (1) 12/03/2022*   Zoster (Shingles) Vaccine (1 of 2) 02/17/2023*   Pneumonia Vaccine (1 of 1 - PCV) 11/17/2023*   Flu Shot  01/19/2023   Medicare Annual Wellness Visit  11/23/2023   DTaP/Tdap/Td vaccine (2 - Td or Tdap) 10/21/2031   Colon Cancer Screening  01/06/2032   Hepatitis C Screening  Completed   HPV Vaccine  Aged Out  *Topic was postponed. The date shown is not the original due date.    Advanced directives: Advance directive discussed with you today.   Conditions/risks identified: none  Next appointment: Follow up in one year for your annual wellness visit.   Preventive Care 28 Years and Older, Male  Preventive care refers to lifestyle choices and visits with your health care provider that can promote health and wellness. What does preventive care include? A yearly physical exam. This is also called an annual well check. Dental exams once or twice a year. Routine eye exams. Ask your health care provider how often you should have your eyes checked. Personal lifestyle choices, including: Daily care of your teeth and gums. Regular physical activity. Eating a healthy diet. Avoiding tobacco and drug use. Limiting alcohol use. Practicing safe sex. Taking low doses of aspirin every day. Taking vitamin and mineral supplements as recommended by your health care provider. What happens during an annual well check? The services and screenings done by your health  care provider during your annual well check will depend on your age, overall health, lifestyle risk factors, and family history of disease. Counseling  Your health care provider may ask you questions about your: Alcohol use. Tobacco use. Drug use. Emotional well-being. Home and relationship well-being. Sexual activity. Eating habits. History of falls. Memory and ability to understand (cognition). Work and work Astronomer. Screening  You may have the following tests or measurements: Height, weight, and BMI. Blood pressure. Lipid and cholesterol levels. These may be checked every 5 years, or more frequently if you are over 44 years old. Skin check. Lung cancer screening. You may have this screening every year starting at age 19 if you have a 30-pack-year history of smoking and currently smoke or have quit within the past 15 years. Fecal occult blood test (FOBT) of the stool. You may have this test every year starting at age 61. Flexible sigmoidoscopy or colonoscopy. You may have a sigmoidoscopy every 5 years or a colonoscopy every 10 years starting at age 33. Prostate cancer screening. Recommendations will vary depending on your family history and other risks. Hepatitis C blood test. Hepatitis B blood test. Sexually transmitted disease (STD) testing. Diabetes screening. This is done by checking your blood sugar (glucose) after you have not eaten for a while (fasting). You may have this done every 1-3 years. Abdominal aortic aneurysm (AAA) screening. You may need this if you are a current or  former smoker. Osteoporosis. You may be screened starting at age 22 if you are at high risk. Talk with your health care provider about your test results, treatment options, and if necessary, the need for more tests. Vaccines  Your health care provider may recommend certain vaccines, such as: Influenza vaccine. This is recommended every year. Tetanus, diphtheria, and acellular pertussis (Tdap, Td)  vaccine. You may need a Td booster every 10 years. Zoster vaccine. You may need this after age 85. Pneumococcal 13-valent conjugate (PCV13) vaccine. One dose is recommended after age 28. Pneumococcal polysaccharide (PPSV23) vaccine. One dose is recommended after age 70. Talk to your health care provider about which screenings and vaccines you need and how often you need them. This information is not intended to replace advice given to you by your health care provider. Make sure you discuss any questions you have with your health care provider. Document Released: 07/03/2015 Document Revised: 02/24/2016 Document Reviewed: 04/07/2015 Elsevier Interactive Patient Education  2017 [employer].  Fall Prevention in the Home Falls can cause injuries. They can happen to people of all ages. There are many things you can do to make your home safe and to help prevent falls. What can I do on the outside of my home? Regularly fix the edges of walkways and driveways and fix any cracks. Remove anything that might make you trip as you walk through a door, such as a raised step or threshold. Trim any bushes or trees on the path to your home. Use bright outdoor lighting. Clear any walking paths of anything that might make someone trip, such as rocks or tools. Regularly check to see if handrails are loose or broken. Make sure that both sides of any steps have handrails. Any raised decks and porches should have guardrails on the edges. Have any leaves, snow, or ice cleared regularly. Use sand or salt on walking paths during winter. Clean up any spills in your garage right away. This includes oil or grease spills. What can I do in the bathroom? Use night lights. Install grab bars by the toilet and in the tub and shower. Do not use towel bars as grab bars. Use non-skid mats or decals in the tub or shower. If you need to sit down in the shower, use a plastic, non-slip stool. Keep the floor dry. Clean up any  water that spills on the floor as soon as it happens. Remove soap buildup in the tub or shower regularly. Attach bath mats securely with double-sided non-slip rug tape. Do not have throw rugs and other things on the floor that can make you trip. What can I do in the bedroom? Use night lights. Make sure that you have a light by your bed that is easy to reach. Do not use any sheets or blankets that are too big for your bed. They should not hang down onto the floor. Have a firm chair that has side arms. You can use this for support while you get dressed. Do not have throw rugs and other things on the floor that can make you trip. What can I do in the kitchen? Clean up any spills right away. Avoid walking on wet floors. Keep items that you use a lot in easy-to-reach places. If you need to reach something above you, use a strong step stool that has a grab bar. Keep electrical cords out of the way. Do not use floor polish or wax that makes floors slippery. If you must use wax, use  non-skid floor wax. Do not have throw rugs and other things on the floor that can make you trip. What can I do with my stairs? Do not leave any items on the stairs. Make sure that there are handrails on both sides of the stairs and use them. Fix handrails that are broken or loose. Make sure that handrails are as long as the stairways. Check any carpeting to make sure that it is firmly attached to the stairs. Fix any carpet that is loose or worn. Avoid having throw rugs at the top or bottom of the stairs. If you do have throw rugs, attach them to the floor with carpet tape. Make sure that you have a light switch at the top of the stairs and the bottom of the stairs. If you do not have them, ask someone to add them for you. What else can I do to help prevent falls? Wear shoes that: Do not have high heels. Have rubber bottoms. Are comfortable and fit you well. Are closed at the toe. Do not wear sandals. If you use a  stepladder: Make sure that it is fully opened. Do not climb a closed stepladder. Make sure that both sides of the stepladder are locked into place. Ask someone to hold it for you, if possible. Clearly mark and make sure that you can see: Any grab bars or handrails. First and last steps. Where the edge of each step is. Use tools that help you move around (mobility aids) if they are needed. These include: Canes. Walkers. Scooters. Crutches. Turn on the lights when you go into a dark area. Replace any light bulbs as soon as they burn out. Set up your furniture so you have a clear path. Avoid moving your furniture around. If any of your floors are uneven, fix them. If there are any pets around you, be aware of where they are. Review your medicines with your doctor. Some medicines can make you feel dizzy. This can increase your chance of falling. Ask your doctor what other things that you can do to help prevent falls. This information is not intended to replace advice given to you by your health care provider. Make sure you discuss any questions you have with your health care provider. Document Released: 04/02/2009 Document Revised: 11/12/2015 Document Reviewed: 07/11/2014 Elsevier Interactive Patient Education  2017 [employer].
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
## 2022-11-27 DIAGNOSIS — R1032 Left lower quadrant pain: Secondary | ICD-10-CM | POA: Insufficient documentation

## 2022-12-01 ENCOUNTER — Ambulatory Visit: Payer: Medicare Other | Admitting: Nurse Practitioner

## 2022-12-01 NOTE — Progress Notes (Deleted)
Hershal Coria Loyed Wilmes,acting as a Neurosurgeon for Arnette Felts, FNP.,have documented all relevant documentation on the behalf of Arnette Felts, FNP,as directed by  Arnette Felts, FNP while in the presence of Arnette Felts, FNP.  Subjective:  Patient ID: Kevin Mckinney , male    DOB: 1955/08/09 , 67 y.o.   MRN: [id number]  No chief complaint on file.   HPI  Patient presents today to discuss lab results, patient reports compliance with medications and has no other concerns today. Patient denies any chest pain, SOB, or headaches.     Past Medical History:  Diagnosis Date  . Anxiety   . Depression   . Diverticulitis   . GERD (gastroesophageal reflux disease)    hx of  . Hemorrhoids      Family History  Problem Relation Age of Onset  . Throat cancer Father        smoker/drinker  . Colon polyps Neg Hx   . Colon cancer Neg Hx   . Esophageal cancer Neg Hx   . Rectal cancer Neg Hx   . Stomach cancer Neg Hx      Current Outpatient Medications:  .  amLODipine (NORVASC) 2.5 MG tablet, Take 1 tablet (2.5 mg total) by mouth daily. (Patient not taking: Reported on 08/09/2022), Disp: 30 tablet, Rfl: 11 .  psyllium (METAMUCIL SMOOTH TEXTURE) 58.6 % powder, Take 1 packet by mouth 3 (three) times daily. (Patient taking differently: Take 1 packet by mouth daily.), Disp: 283 g, Rfl: 12 .  sildenafil (VIAGRA) 100 MG tablet, Take 0.5 tablets (50 mg total) by mouth daily as needed for erectile dysfunction. (Patient not taking: Reported on 11/17/2022), Disp: 20 tablet, Rfl: 5 .  tretinoin (RETIN-A) 0.025 % cream, Apply topically at bedtime. Apply a pea sized amount to the face three nights a week (Patient not taking: Reported on 11/17/2022), Disp: 45 g, Rfl: 0   No Known Allergies   Review of Systems   There were no vitals filed for this visit. There is no height or weight on file to calculate BMI.  Wt Readings from Last 3 Encounters:  11/23/22 215 lb (97.5 kg)  11/17/22 215 lb 9.6 oz (97.8 kg)  08/09/22  212 lb (96.2 kg)    The ASCVD Risk score (Arnett DK, et al., 2019) failed to calculate for the following reasons:   The systolic blood pressure is missing  Objective:  Physical Exam      Assessment And Plan:  There are no diagnoses linked to this encounter.   No follow-ups on file.  Patient was given opportunity to ask questions. Patient verbalized understanding of the plan and was able to repeat key elements of the plan. All questions were answered to their satisfaction.  Arnette Felts, FNP  I, Arnette Felts, FNP, have reviewed all documentation for this visit. The documentation on 12/01/22 for the exam, diagnosis, procedures, and orders are all accurate and complete.   IF YOU HAVE BEEN REFERRED TO A SPECIALIST, IT MAY TAKE 1-2 WEEKS TO SCHEDULE/PROCESS THE REFERRAL. IF YOU HAVE NOT HEARD FROM US/SPECIALIST IN TWO WEEKS, PLEASE GIVE [place] A CALL AT [phone].

## 2023-01-09 ENCOUNTER — Ambulatory Visit: Payer: Medicare Other | Admitting: Dermatology

## 2023-05-06 ENCOUNTER — Other Ambulatory Visit: Payer: Self-pay | Admitting: Nurse Practitioner

## 2023-05-06 DIAGNOSIS — R030 Elevated blood-pressure reading, without diagnosis of hypertension: Secondary | ICD-10-CM

## 2023-05-22 ENCOUNTER — Other Ambulatory Visit: Payer: Self-pay | Admitting: Nurse Practitioner

## 2023-05-22 DIAGNOSIS — R030 Elevated blood-pressure reading, without diagnosis of hypertension: Secondary | ICD-10-CM

## 2023-05-22 MED ORDER — AMLODIPINE BESYLATE 2.5 MG PO TABS
2.5000 mg | ORAL_TABLET | Freq: Every day | ORAL | 11 refills | Status: DC
Start: 2023-05-22 — End: 2023-08-14

## 2023-08-10 ENCOUNTER — Ambulatory Visit: Payer: Medicare Other | Admitting: Nurse Practitioner

## 2023-08-14 ENCOUNTER — Encounter: Payer: Self-pay | Admitting: Nurse Practitioner

## 2023-08-14 ENCOUNTER — Ambulatory Visit (INDEPENDENT_AMBULATORY_CARE_PROVIDER_SITE_OTHER): Payer: Medicare Other | Admitting: Nurse Practitioner

## 2023-08-14 VITALS — BP 138/80 | HR 78 | Temp 97.7°F | Ht 71.0 in | Wt 214.8 lb

## 2023-08-14 DIAGNOSIS — F3289 Other specified depressive episodes: Secondary | ICD-10-CM

## 2023-08-14 DIAGNOSIS — Z2821 Immunization not carried out because of patient refusal: Secondary | ICD-10-CM | POA: Diagnosis not present

## 2023-08-14 DIAGNOSIS — M545 Low back pain, unspecified: Secondary | ICD-10-CM

## 2023-08-14 DIAGNOSIS — M79644 Pain in right finger(s): Secondary | ICD-10-CM

## 2023-08-14 DIAGNOSIS — R030 Elevated blood-pressure reading, without diagnosis of hypertension: Secondary | ICD-10-CM

## 2023-08-14 DIAGNOSIS — N529 Male erectile dysfunction, unspecified: Secondary | ICD-10-CM | POA: Diagnosis not present

## 2023-08-14 DIAGNOSIS — I700 Atherosclerosis of aorta: Secondary | ICD-10-CM

## 2023-08-14 DIAGNOSIS — R39198 Other difficulties with micturition: Secondary | ICD-10-CM | POA: Insufficient documentation

## 2023-08-14 DIAGNOSIS — I10 Essential (primary) hypertension: Secondary | ICD-10-CM

## 2023-08-14 DIAGNOSIS — R7309 Other abnormal glucose: Secondary | ICD-10-CM | POA: Diagnosis not present

## 2023-08-14 MED ORDER — TADALAFIL 20 MG PO TABS: 20.0000 mg | ORAL_TABLET | ORAL | 1 refills | Status: AC | PRN

## 2023-08-14 MED ORDER — AMLODIPINE BESYLATE 2.5 MG PO TABS: 2.5000 mg | ORAL_TABLET | Freq: Every day | ORAL | 1 refills | Status: DC

## 2023-08-14 NOTE — Progress Notes (Unsigned)
 Madelaine Bhat, CMA,acting as a Neurosurgeon for Arnette Felts, FNP.,have documented all relevant documentation on the behalf of Arnette Felts, FNP,as directed by  Arnette Felts, FNP while in the presence of Arnette Felts, FNP.  Subjective:  Patient ID: Kevin Mckinney , male    DOB: 01-23-1956 , 68 y.o.   MRN: [id number]  No chief complaint on file.   HPI  Patient presents today for back pain, Patient reports compliance with medication. Patient denies any chest pain, SOB, or headaches. Patient reports he thinks he pulled a muscle his back pain is much better. Patient reports he still feels it if it moves a certain way. Patient reports he saw a video on instagram about prostate issues and would like to see urology. Patient reports he saw that erectile dysfunction is a sign of a enlarged prostate.  Patient hasn't taking amLODipine in 3 weeks.      Past Medical History:  Diagnosis Date   Anxiety    Depression    Diverticulitis    GERD (gastroesophageal reflux disease)    hx of   Hemorrhoids      Family History  Problem Relation Age of Onset   Throat cancer Father        smoker/drinker   Colon polyps Neg Hx    Colon cancer Neg Hx    Esophageal cancer Neg Hx    Rectal cancer Neg Hx    Stomach cancer Neg Hx      Current Outpatient Medications:    psyllium (METAMUCIL SMOOTH TEXTURE) 58.6 % powder, Take 1 packet by mouth 3 (three) times daily. (Patient taking differently: Take 1 packet by mouth daily.), Disp: 283 g, Rfl: 12   tadalafil (CIALIS) 20 MG tablet, Take 1 tablet (20 mg total) by mouth as needed for erectile dysfunction., Disp: 20 tablet, Rfl: 1   amLODipine (NORVASC) 2.5 MG tablet, Take 1 tablet (2.5 mg total) by mouth daily., Disp: 90 tablet, Rfl: 1   tretinoin (RETIN-A) 0.025 % cream, Apply topically at bedtime. Apply a pea sized amount to the face three nights a week (Patient not taking: Reported on 08/14/2023), Disp: 45 g, Rfl: 0   No Known Allergies   Review of Systems   Constitutional: Negative.   HENT: Negative.    Eyes: Negative.   Respiratory: Negative.    Cardiovascular: Negative.   Gastrointestinal: Negative.   Musculoskeletal:  Positive for arthralgias (right hand first finger).  Neurological: Negative.   Psychiatric/Behavioral: Negative.    All other systems reviewed and are negative.    Today's Vitals   08/14/23 1421 08/14/23 1504  BP: (!) 140/84 138/80  Pulse: 78   Temp: 97.7 F (36.5 C)   TempSrc: Oral   Weight: 214 lb 12.8 oz (97.4 kg)   Height: 5\' 11"  (1.803 m)   PainSc: 2    PainLoc: Back    Body mass index is 29.96 kg/m.  Wt Readings from Last 3 Encounters:  08/14/23 214 lb 12.8 oz (97.4 kg)  11/23/22 215 lb (97.5 kg)  11/17/22 215 lb 9.6 oz (97.8 kg)     Objective:  Physical Exam Vitals and nursing note reviewed.  Constitutional:      General: He is not in acute distress.    Appearance: Normal appearance.  Cardiovascular:     Rate and Rhythm: Normal rate and regular rhythm.     Pulses: Normal pulses.     Heart sounds: Normal heart sounds. No murmur heard. Pulmonary:     Effort: Pulmonary  effort is normal. No respiratory distress.     Breath sounds: Normal breath sounds. No wheezing.  Musculoskeletal:        General: No swelling or tenderness. Normal range of motion.  Skin:    General: Skin is warm and dry.     Capillary Refill: Capillary refill takes less than 2 seconds.     Findings: No lesion.  Neurological:     General: No focal deficit present.     Mental Status: He is alert and oriented to person, place, and time.     Cranial Nerves: No cranial nerve deficit.     Motor: No weakness.  Psychiatric:        Mood and Affect: Mood normal.        Behavior: Behavior normal.        Thought Content: Thought content normal.        Judgment: Judgment normal.      Assessment And Plan:  Acute right-sided low back pain without sciatica Assessment & Plan: His pain has improved since his call for his appt.     Atherosclerosis of aorta (HCC) Assessment & Plan: Continue statin, not taking regularly.   Orders: -     CMP14+EGFR  Erectile dysfunction, unspecified erectile dysfunction type Assessment & Plan: Will refer to urology, I have also sent in cialis to see if that is more effective.   Orders: -     Ambulatory referral to Urology -     PSA -     Tadalafil; Take 1 tablet (20 mg total) by mouth as needed for erectile dysfunction.  Dispense: 20 tablet; Refill: 1  Pain in right finger(s) Assessment & Plan: Will check xray for structural abnormalities.  Orders: -     DG Hand Complete Right; Future  Decreased urine stream Assessment & Plan: Will refer to urology at patient request  Orders: -     PSA  COVID-19 vaccination declined Assessment & Plan: Declines covid 19 vaccine. Discussed risk of covid 55 and if he changes her mind about the vaccine to call the office. Education has been provided regarding the importance of this vaccine but patient still declined. Advised may receive this vaccine at local pharmacy or Health Dept.or vaccine clinic. Aware to provide a copy of the vaccination record if obtained from local pharmacy or Health Dept.  Encouraged to take multivitamin, vitamin d, vitamin c and zinc to increase immune system. Aware can call office if would like to have vaccine here at office. Verbalized acceptance and understanding.    Influenza vaccination declined Assessment & Plan: Patient declined influenza vaccination at this time. Patient is aware that influenza vaccine prevents illness in 70% of healthy people, and reduces hospitalizations to 30-70% in elderly. This vaccine is recommended annually. Education has been provided regarding the importance of this vaccine but patient still declined. Advised may receive this vaccine at local pharmacy or Health Dept.or vaccine clinic. Aware to provide a copy of the vaccination record if obtained from local pharmacy or Health Dept.   Pt is willing to accept risk associated with refusing vaccination.    Herpes zoster vaccination declined Assessment & Plan: Declines shingrix, educated on disease process and is aware if he changes his mind to notify office    Essential hypertension Assessment & Plan: Blood pressure is fairly controlled, continue current medications.   Orders: -     CMP14+EGFR  Abnormal glucose Assessment & Plan: Hgba1c is stable. Continue focusing on healthy diet and regular exercise  Orders: -     Hemoglobin A1c  Elevated BP without diagnosis of hypertension -     amLODIPine Besylate; Take 1 tablet (2.5 mg total) by mouth daily.  Dispense: 90 tablet; Refill: 1  Other depression Assessment & Plan: I am going to refer him again to counseling. Depression score is 10.   Orders: -     Ambulatory referral to Psychology    Return for keep same next.  Patient was given opportunity to ask questions. Patient verbalized understanding of the plan and was able to repeat key elements of the plan. All questions were answered to their satisfaction.    Jeanell Sparrow, FNP, have reviewed all documentation for this visit. The documentation on 08/14/23 for the exam, diagnosis, procedures, and orders are all accurate and complete.   IF YOU HAVE BEEN REFERRED TO A SPECIALIST, IT MAY TAKE 1-2 WEEKS TO SCHEDULE/PROCESS THE REFERRAL. IF YOU HAVE NOT HEARD FROM US/SPECIALIST IN TWO WEEKS, PLEASE GIVE [place] A CALL AT [phone].

## 2023-08-14 NOTE — Patient Instructions (Signed)
 You can go to [facility] for your xray of your hand you do not need an appt

## 2023-08-15 ENCOUNTER — Encounter: Payer: Self-pay | Admitting: Nurse Practitioner

## 2023-08-15 LAB — CMP14+EGFR
ALT: 18 IU/L (ref 0–44)
AST: 19 IU/L (ref 0–40)
Albumin: 4.3 g/dL (ref 3.9–4.9)
Alkaline Phosphatase: 97 IU/L (ref 44–121)
BUN/Creatinine Ratio: 14 (ref 10–24)
BUN: 17 mg/dL (ref 8–27)
Bilirubin Total: 0.4 mg/dL (ref 0.0–1.2)
CO2: 19 mmol/L — ABNORMAL LOW (ref 20–29)
Calcium: 9.3 mg/dL (ref 8.6–10.2)
Chloride: 107 mmol/L — ABNORMAL HIGH (ref 96–106)
Creatinine, Ser: 1.25 mg/dL (ref 0.76–1.27)
Globulin, Total: 2.8 g/dL (ref 1.5–4.5)
Glucose: 89 mg/dL (ref 70–99)
Potassium: 4.2 mmol/L (ref 3.5–5.2)
Sodium: 143 mmol/L (ref 134–144)
Total Protein: 7.1 g/dL (ref 6.0–8.5)
eGFR: 63 mL/min/{1.73_m2} (ref >59)

## 2023-08-15 LAB — HEMOGLOBIN A1C
Est. average glucose Bld gHb Est-mCnc: 126 mg/dL
Hgb A1c MFr Bld: 6 % — ABNORMAL HIGH (ref 4.8–5.6)

## 2023-08-15 LAB — PSA: Prostate Specific Ag, Serum: 3.5 ng/mL (ref 0.0–4.0)

## 2023-08-18 DIAGNOSIS — R7309 Other abnormal glucose: Secondary | ICD-10-CM | POA: Insufficient documentation

## 2023-08-18 DIAGNOSIS — Z2821 Immunization not carried out because of patient refusal: Secondary | ICD-10-CM | POA: Insufficient documentation

## 2023-08-18 NOTE — Assessment & Plan Note (Signed)

## 2023-08-18 NOTE — Assessment & Plan Note (Signed)
 I am going to refer him again to counseling. Depression score is 10.

## 2023-08-18 NOTE — Assessment & Plan Note (Signed)
 Hgba1c is stable. Continue focusing on healthy diet and regular exercise

## 2023-08-18 NOTE — Assessment & Plan Note (Signed)
 Will refer to urology at patient request

## 2023-08-18 NOTE — Assessment & Plan Note (Signed)
 Continue statin, not taking regularly.

## 2023-08-18 NOTE — Assessment & Plan Note (Signed)
Blood pressure is fairly controlled, continue current medications.  

## 2023-08-18 NOTE — Assessment & Plan Note (Signed)
 His pain has improved since his call for his appt.

## 2023-08-18 NOTE — Assessment & Plan Note (Signed)
 Will check xray for structural abnormalities.

## 2023-08-18 NOTE — Assessment & Plan Note (Signed)
 Will refer to urology, I have also sent in cialis to see if that is more effective.

## 2023-08-18 NOTE — Assessment & Plan Note (Signed)

## 2023-08-18 NOTE — Assessment & Plan Note (Signed)
 Declines shingrix, educated on disease process and is aware if he changes his mind to notify office

## 2023-08-23 DIAGNOSIS — H40013 Open angle with borderline findings, low risk, bilateral: Secondary | ICD-10-CM | POA: Diagnosis not present

## 2023-08-23 DIAGNOSIS — H25813 Combined forms of age-related cataract, bilateral: Secondary | ICD-10-CM | POA: Diagnosis not present

## 2023-09-19 ENCOUNTER — Ambulatory Visit: Admitting: Urology

## 2023-09-19 ENCOUNTER — Encounter: Payer: Self-pay | Admitting: Urology

## 2023-09-19 VITALS — BP 121/82 | HR 60 | Ht 72.0 in | Wt 223.0 lb

## 2023-09-19 DIAGNOSIS — N529 Male erectile dysfunction, unspecified: Secondary | ICD-10-CM | POA: Diagnosis not present
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
## 2023-11-20 NOTE — Progress Notes (Deleted)
 Del Favia, CMA,acting as a Neurosurgeon for Kevin Epley, FNP.,have documented all relevant documentation on the behalf of Kevin Epley, FNP,as directed by  Kevin Epley, FNP while in the presence of Kevin Epley, FNP.  Subjective:  Patient ID: Kevin Mckinney , male    DOB: 05-Jun-1956 , 68 y.o.   MRN: [id number]  No chief complaint on file.   HPI  HPI   Past Medical History:  Diagnosis Date   Anxiety    Depression    Diverticulitis    GERD (gastroesophageal reflux disease)    hx of   Hemorrhoids      Family History  Problem Relation Age of Onset   Throat cancer Father        smoker/drinker   Colon polyps Neg Hx    Colon cancer Neg Hx    Esophageal cancer Neg Hx    Rectal cancer Neg Hx    Stomach cancer Neg Hx      Current Outpatient Medications:    amLODipine  (NORVASC ) 2.5 MG tablet, Take 1 tablet (2.5 mg total) by mouth daily., Disp: 90 tablet, Rfl: 1   psyllium (METAMUCIL SMOOTH TEXTURE) 58.6 % powder, Take 1 packet by mouth 3 (three) times daily. (Patient taking differently: Take 1 packet by mouth daily.), Disp: 283 g, Rfl: 12   tadalafil  (CIALIS ) 20 MG tablet, Take 1 tablet (20 mg total) by mouth as needed for erectile dysfunction., Disp: 20 tablet, Rfl: 1   No Known Allergies   Review of Systems   There were no vitals filed for this visit. There is no height or weight on file to calculate BMI.  Wt Readings from Last 3 Encounters:  09/19/23 223 lb (101.2 kg)  08/14/23 214 lb 12.8 oz (97.4 kg)  11/23/22 215 lb (97.5 kg)    The 10-year ASCVD risk score (Arnett DK, et al., 2019) is: 15.4%   Values used to calculate the score:     Age: 44 years     Sex: Male     Is Non-Hispanic African American: Yes     Diabetic: No     Tobacco smoker: No     Systolic Blood Pressure: 121 mmHg     Is BP treated: Yes     HDL Cholesterol: 43 mg/dL     Total Cholesterol: 145 mg/dL  Objective:  Physical Exam      Assessment And Plan:  Encounter for annual health  examination  Essential hypertension    No follow-ups on file.  Patient was given opportunity to ask questions. Patient verbalized understanding of the plan and was able to repeat key elements of the plan. All questions were answered to their satisfaction.    Inge Mangle, FNP, have reviewed all documentation for this visit. The documentation on 11/20/23 for the exam, diagnosis, procedures, and orders are all accurate and complete.   IF YOU HAVE BEEN REFERRED TO A SPECIALIST, IT MAY TAKE 1-2 WEEKS TO SCHEDULE/PROCESS THE REFERRAL. IF YOU HAVE NOT HEARD FROM US /SPECIALIST IN TWO WEEKS, PLEASE GIVE US  A CALL AT [phone].

## 2023-11-21 ENCOUNTER — Encounter: Payer: Medicare Other | Admitting: Nurse Practitioner

## 2023-11-21 DIAGNOSIS — Z0000 Encounter for general adult medical examination without abnormal findings: Secondary | ICD-10-CM

## 2023-11-21 DIAGNOSIS — I10 Essential (primary) hypertension: Secondary | ICD-10-CM

## 2023-11-23 ENCOUNTER — Ambulatory Visit: Payer: Self-pay | Admitting: Nurse Practitioner

## 2023-11-23 NOTE — Progress Notes (Deleted)
 Del Favia, CMA,acting as a Neurosurgeon for Susanna Epley, FNP.,have documented all relevant documentation on the behalf of Susanna Epley, FNP,as directed by  Susanna Epley, FNP while in the presence of Susanna Epley, FNP.  Subjective:  Patient ID: Kevin Mckinney , male    DOB: 1956/06/19 , 68 y.o.   MRN: [id number]  No chief complaint on file.   HPI  HPI   Past Medical History:  Diagnosis Date   Anxiety    Depression    Diverticulitis    GERD (gastroesophageal reflux disease)    hx of   Hemorrhoids      Family History  Problem Relation Age of Onset   Throat cancer Father        smoker/drinker   Colon polyps Neg Hx    Colon cancer Neg Hx    Esophageal cancer Neg Hx    Rectal cancer Neg Hx    Stomach cancer Neg Hx      Current Outpatient Medications:    amLODipine  (NORVASC ) 2.5 MG tablet, Take 1 tablet (2.5 mg total) by mouth daily., Disp: 90 tablet, Rfl: 1   psyllium (METAMUCIL SMOOTH TEXTURE) 58.6 % powder, Take 1 packet by mouth 3 (three) times daily. (Patient taking differently: Take 1 packet by mouth daily.), Disp: 283 g, Rfl: 12   tadalafil  (CIALIS ) 20 MG tablet, Take 1 tablet (20 mg total) by mouth as needed for erectile dysfunction., Disp: 20 tablet, Rfl: 1   No Known Allergies   Review of Systems   There were no vitals filed for this visit. There is no height or weight on file to calculate BMI.  Wt Readings from Last 3 Encounters:  09/19/23 223 lb (101.2 kg)  08/14/23 214 lb 12.8 oz (97.4 kg)  11/23/22 215 lb (97.5 kg)    The 10-year ASCVD risk score (Arnett DK, et al., 2019) is: 15.4%   Values used to calculate the score:     Age: 25 years     Sex: Male     Is Non-Hispanic African American: Yes     Diabetic: No     Tobacco smoker: No     Systolic Blood Pressure: 121 mmHg     Is BP treated: Yes     HDL Cholesterol: 43 mg/dL     Total Cholesterol: 145 mg/dL  Objective:  Physical Exam      Assessment And Plan:  Essential hypertension    No  follow-ups on file.  Patient was given opportunity to ask questions. Patient verbalized understanding of the plan and was able to repeat key elements of the plan. All questions were answered to their satisfaction.    Inge Mangle, FNP, have reviewed all documentation for this visit. The documentation on 11/23/23 for the exam, diagnosis, procedures, and orders are all accurate and complete.   IF YOU HAVE BEEN REFERRED TO A SPECIALIST, IT MAY TAKE 1-2 WEEKS TO SCHEDULE/PROCESS THE REFERRAL. IF YOU HAVE NOT HEARD FROM US /SPECIALIST IN TWO WEEKS, PLEASE GIVE US  A CALL AT [phone] X [phone].

## 2023-12-13 ENCOUNTER — Ambulatory Visit (INDEPENDENT_AMBULATORY_CARE_PROVIDER_SITE_OTHER): Payer: Medicare Other

## 2023-12-13 VITALS — BP 128/80 | HR 97 | Temp 98.5°F | Ht 72.0 in | Wt 214.8 lb

## 2023-12-13 DIAGNOSIS — Z0000 Encounter for general adult medical examination without abnormal findings: Secondary | ICD-10-CM | POA: Diagnosis not present
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
## 2023-12-13 NOTE — Patient Instructions (Signed)
 Mr. Kevin Mckinney , Thank you for taking time out of your busy schedule to complete your Annual Wellness Visit with me. I enjoyed our conversation and look forward to speaking with you again next year. I, as well as your care team,  appreciate your ongoing commitment to your health goals. Please review the following plan we discussed and let me know if I can assist you in the future. Your Game plan/ To Do List    Referrals: If you haven't heard from the office you've been referred to, please reach out to them at the phone provided.  N/a Follow up Visits: Next Medicare AWV with our clinical staff: office will schedule   Have you seen your provider in the last 6 months (3 months if uncontrolled diabetes)? Yes Next Office Visit with your provider: 01/31/2024 at 11:20  Clinician Recommendations:  Aim for 30 minutes of exercise or brisk walking, 6-8 glasses of water, and 5 servings of fruits and vegetables each day.       This is a list of the screening recommended for you and due dates:  Health Maintenance  Topic Date Due   COVID-19 Vaccine (1) 12/29/2023*   Zoster (Shingles) Vaccine (1 of 2) 03/14/2024*   Pneumococcal Vaccine for age over 62 (1 of 1 - PCV) 12/12/2024*   Flu Shot  01/19/2024   Medicare Annual Wellness Visit  12/12/2024   DTaP/Tdap/Td vaccine (2 - Td or Tdap) 10/21/2031   Colon Cancer Screening  01/06/2032   Hepatitis C Screening  Completed   Hepatitis B Vaccine  Aged Out   HPV Vaccine  Aged Out   Meningitis B Vaccine  Aged Out  *Topic was postponed. The date shown is not the original due date.    Advanced directives: (Declined) Advance directive discussed with you today. Even though you declined this today, please call our office should you change your mind, and we can give you the proper paperwork for you to fill out. Advance Care Planning is important because it:  [x]  Makes sure you receive the medical care that is consistent with your values, goals, and preferences  [x]   It provides guidance to your family and loved ones and reduces their decisional burden about whether or not they are making the right decisions based on your wishes.  Follow the link provided in your after visit summary or read over the paperwork we have mailed to you to help you started getting your Advance Directives in place. If you need assistance in completing these, please reach out to us  so that we can help you!  See attachments for Preventive Care and Fall Prevention Tips.

## 2024-01-31 ENCOUNTER — Encounter: Payer: Self-pay | Admitting: Nurse Practitioner

## 2024-01-31 NOTE — Progress Notes (Unsigned)
 [name], CMA,acting as a Neurosurgeon for Kevin [name], FNP.,have documented all relevant documentation on the behalf of Kevin [name], FNP,as directed by  Kevin [name], FNP while in the presence of Kevin [name], FNP.  Subjective:   Patient ID: Kevin Mckinney , male    DOB: 04/24/1956 , 68 y.o.   MRN: [id number]  No chief complaint on file.   HPI  HPI   Past Medical History:  Diagnosis Date   Anxiety    Clotting disorder (HCC)    Hyroids   Depression    Diverticulitis    GERD (gastroesophageal reflux disease)    hx of   Hemorrhoids      Family History  Problem Relation Age of Onset   Throat cancer Father        smoker/drinker   ADD / ADHD Son    Colon polyps Neg Hx    Colon cancer Neg Hx    Esophageal cancer Neg Hx    Rectal cancer Neg Hx    Stomach cancer Neg Hx      Current Outpatient Medications:    amLODipine  (NORVASC ) 2.5 MG tablet, Take 1 tablet (2.5 mg total) by mouth daily., Disp: 90 tablet, Rfl: 1   psyllium (METAMUCIL SMOOTH TEXTURE) 58.6 % powder, Take 1 packet by mouth 3 (three) times daily., Disp: 283 g, Rfl: 12   tadalafil  (CIALIS ) 20 MG tablet, Take 1 tablet (20 mg total) by mouth as needed for erectile dysfunction., Disp: 20 tablet, Rfl: 1   No Known Allergies   Men's preventive visit. Patient Health Questionnaire (PHQ-2) is  Flowsheet Row Clinical Support from 12/13/2023 in [facility]  PHQ-2 Total Score 3  . Patient is on a *** diet. Marital status: Single. Relevant history for alcohol use is:  Social History   Substance and Sexual Activity  Alcohol Use Not Currently   Alcohol/week: 0.0 - 1.0 standard drinks of alcohol   Comment: occ  . Relevant history for tobacco use is:  Social History   Tobacco Use  Smoking Status Never  Smokeless Tobacco Never  .   Review of Systems   There were no vitals filed for this visit. There is no height or weight on file to calculate BMI.  Wt Readings from Last 3  Encounters:  12/13/23 214 lb 12.8 oz (97.4 kg)  09/19/23 223 lb (101.2 kg)  08/14/23 214 lb 12.8 oz (97.4 kg)    Objective:  Physical Exam      Assessment And Plan:    Encounter for annual health examination  Essential hypertension  Abnormal glucose  Erectile dysfunction, unspecified erectile dysfunction type  Depression, unspecified depression type     No follow-ups on file. Patient was given opportunity to ask questions. Patient verbalized understanding of the plan and was able to repeat key elements of the plan. All questions were answered to their satisfaction.   Kevin [name], FNP  I, Kevin [name], FNP, have reviewed all documentation for this visit. The documentation on 01/31/24 for the exam, diagnosis, procedures, and orders are all accurate and complete.

## 2024-01-31 NOTE — Progress Notes (Deleted)
 Kevin Mckinney, CMA,acting as a Neurosurgeon for [name], FNP.,have documented all relevant documentation on the behalf of [name], FNP,as directed by  [name], FNP while in the presence of [name], FNP.  Subjective:   Patient ID: Kevin Mckinney , male    DOB: 03/01/1956 , 69 y.o.   MRN: [id number]  No chief complaint on file.   HPI  HPI   Past Medical History:  Diagnosis Date   Anxiety    Clotting disorder (HCC)    Hyroids   Depression    Diverticulitis    GERD (gastroesophageal reflux disease)    hx of   Hemorrhoids      Family History  Problem Relation Age of Onset   Throat cancer Father        smoker/drinker   ADD / ADHD Son    Colon polyps Neg Hx    Colon cancer Neg Hx    Esophageal cancer Neg Hx    Rectal cancer Neg Hx    Stomach cancer Neg Hx      Current Outpatient Medications:    amLODipine (NORVASC) 2.5 MG tablet, Take 1 tablet (2.5 mg total) by mouth daily., Disp: 90 tablet, Rfl: 1   psyllium (METAMUCIL SMOOTH TEXTURE) 58.6 % powder, Take 1 packet by mouth 3 (three) times daily., Disp: 283 g, Rfl: 12   tadalafil (CIALIS) 20 MG tablet, Take 1 tablet (20 mg total) by mouth as needed for erectile dysfunction., Disp: 20 tablet, Rfl: 1   No Known Allergies   Men's preventive visit. Patient Health Questionnaire (PHQ-2) is  Flowsheet Row Clinical Support from 12/13/2023 in [facility]  PHQ-2 Total Score 3  . Patient is on a *** diet. Marital status: Single. Relevant history for alcohol use is:  Social History   Substance and Sexual Activity  Alcohol Use Not Currently   Alcohol/week: 0.0 - 1.0 standard drinks of alcohol   Comment: occ  . Relevant history for tobacco use is:  Social History   Tobacco Use  Smoking Status Never  Smokeless Tobacco Never  .   Review of Systems   There were no vitals filed for this visit. There is no height or weight on file to calculate BMI.  Wt Readings from Last 3  Encounters:  12/13/23 214 lb 12.8 oz (97.4 kg)  09/19/23 223 lb (101.2 kg)  08/14/23 214 lb 12.8 oz (97.4 kg)    Objective:  Physical Exam      Assessment And Plan:    Encounter for annual health examination  Essential hypertension  Abnormal glucose  Erectile dysfunction, unspecified erectile dysfunction type  Depression, unspecified depression type     No follow-ups on file. Patient was given opportunity to ask questions. Patient verbalized understanding of the plan and was able to repeat key elements of the plan. All questions were answered to their satisfaction.   [name], FNP  I, [name], FNP, have reviewed all documentation for this visit. The documentation on 01/31/24 for the exam, diagnosis, procedures, and orders are all accurate and complete.

## 2024-04-09 ENCOUNTER — Other Ambulatory Visit: Payer: Self-pay

## 2024-04-09 ENCOUNTER — Emergency Department (HOSPITAL_COMMUNITY): Admission: EM | Admit: 2024-04-09 | Discharge: 2024-04-09 | Disposition: A | Discharge status: Home / Self Care

## 2024-04-09 DIAGNOSIS — X58XXXA Exposure to other specified factors, initial encounter: Secondary | ICD-10-CM | POA: Diagnosis not present

## 2024-04-09 DIAGNOSIS — S0502XA Injury of conjunctiva and corneal abrasion without foreign body, left eye, initial encounter: Secondary | ICD-10-CM | POA: Insufficient documentation

## 2024-04-09 DIAGNOSIS — H5789 Other specified disorders of eye and adnexa: Secondary | ICD-10-CM | POA: Diagnosis present

## 2024-04-09 MED ORDER — FLUORESCEIN SODIUM 1 MG OP STRP
1.0000 | ORAL_STRIP | Freq: Once | OPHTHALMIC | Status: AC
  Administered 2024-04-09: 1 via OPHTHALMIC
  Filled 2024-04-09: qty 1

## 2024-04-09 MED ORDER — ERYTHROMYCIN 5 MG/GM OP OINT: TOPICAL_OINTMENT | OPHTHALMIC | 0 refills | Status: AC

## 2024-04-09 MED ORDER — TETRACAINE HCL 0.5 % OP SOLN
1.0000 [drp] | Freq: Once | OPHTHALMIC | Status: AC
  Administered 2024-04-09: 2 [drp] via OPHTHALMIC
  Filled 2024-04-09: qty 4

## 2024-04-09 NOTE — Discharge Instructions (Addendum)
 You were evaluated in the emergency room for left eye redness.  Your exam was most consistent with a corneal abrasion.  A prescription for eyedrops was sent into your pharmacy.  Please take this as instructed.  Please call the number listed sheet to follow-up with the ophthalmologist.  If you experience any new or worsening symptoms including worsening pain, vision loss please return to the emergency room.
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
## 2024-04-09 NOTE — ED Triage Notes (Signed)
 Pt has c/o left eye redness that keeps spreading. Pt denies pain or blurred vision- states he was sanding some drywall a few days ago without eye protection.

## 2024-04-10 ENCOUNTER — Ambulatory Visit: Admitting: Nurse Practitioner

## 2024-04-10 DIAGNOSIS — H1133 Conjunctival hemorrhage, bilateral: Secondary | ICD-10-CM | POA: Diagnosis not present

## 2024-04-10 NOTE — Progress Notes (Deleted)
    Subjective:  Patient ID: Kevin Mckinney , male    DOB: 1956-04-08 , 68 y.o.   MRN: [id number]  No chief complaint on file.   HPI HPI  Discussed the use of AI scribe software for clinical note transcription with the patient, who gave verbal consent to proceed.      Past Medical History:  Diagnosis Date  . Anxiety   . Clotting disorder    Hyroids  . Depression   . Diverticulitis   . GERD (gastroesophageal reflux disease)    hx of  . Hemorrhoids      Family History  Problem Relation Age of Onset  . Throat cancer Father        smoker/drinker  . ADD / ADHD Son   . Colon polyps Neg Hx   . Colon cancer Neg Hx   . Esophageal cancer Neg Hx   . Rectal cancer Neg Hx   . Stomach cancer Neg Hx      Current Outpatient Medications:  .  amLODipine  (NORVASC ) 2.5 MG tablet, Take 1 tablet (2.5 mg total) by mouth daily., Disp: 90 tablet, Rfl: 1 .  erythromycin ophthalmic ointment, Place a 1/2 inch ribbon of ointment into the lower eyelid 4 times a day for 3-5 days, Disp: 3.5 g, Rfl: 0 .  psyllium (METAMUCIL SMOOTH TEXTURE) 58.6 % powder, Take 1 packet by mouth 3 (three) times daily., Disp: 283 g, Rfl: 12 .  tadalafil  (CIALIS ) 20 MG tablet, Take 1 tablet (20 mg total) by mouth as needed for erectile dysfunction., Disp: 20 tablet, Rfl: 1   No Known Allergies   Review of Systems   There were no vitals filed for this visit. There is no height or weight on file to calculate BMI.  Wt Readings from Last 3 Encounters:  12/13/23 214 lb 12.8 oz (97.4 kg)  09/19/23 223 lb (101.2 kg)  08/14/23 214 lb 12.8 oz (97.4 kg)    The 10-year ASCVD risk score (Arnett DK, et al., 2019) is: 24.1%   Values used to calculate the score:     Age: 49 years     Clincally relevant sex: Male     Is Non-Hispanic African American: Yes     Diabetic: No     Tobacco smoker: No     Systolic Blood Pressure: 157 mmHg     Is BP treated: Yes     HDL Cholesterol: 43 mg/dL     Total Cholesterol: 145  mg/dL  Objective:  Physical Exam      Assessment And Plan:  There are no diagnoses linked to this encounter.  Assessment and Plan      No follow-ups on file.  Patient was given opportunity to ask questions. Patient verbalized understanding of the plan and was able to repeat key elements of the plan. All questions were answered to their satisfaction.    LILLETTE [name], FNP, have reviewed all documentation for this visit. The documentation on 04/10/24 for the exam, diagnosis, procedures, and orders are all accurate and complete.   IF YOU HAVE BEEN REFERRED TO A SPECIALIST, IT MAY TAKE 1-2 WEEKS TO SCHEDULE/PROCESS THE REFERRAL. IF YOU HAVE NOT HEARD FROM US /SPECIALIST IN TWO WEEKS, PLEASE GIVE US  A CALL AT [phone].

## 2024-05-24 ENCOUNTER — Other Ambulatory Visit: Payer: Self-pay | Admitting: Nurse Practitioner

## 2024-05-24 DIAGNOSIS — R030 Elevated blood-pressure reading, without diagnosis of hypertension: Secondary | ICD-10-CM

## 2024-05-30 ENCOUNTER — Telehealth: Payer: Self-pay

## 2024-05-30 NOTE — Telephone Encounter (Signed)
 LVM for patient to call back and schedule appointment

## 2024-06-28 ENCOUNTER — Other Ambulatory Visit: Payer: Self-pay | Admitting: Nurse Practitioner

## 2024-06-28 DIAGNOSIS — R030 Elevated blood-pressure reading, without diagnosis of hypertension: Secondary | ICD-10-CM

## 2024-07-02 NOTE — Progress Notes (Signed)
 [name], CMA,acting as a neurosurgeon for [name], FNP.,have documented all relevant documentation on the behalf of [name], FNP,as directed by  [name], FNP while in the presence of [name], FNP.  Subjective:  Patient ID: Kevin Mckinney , male    DOB: 10/17/1955 , 69 y.o.   MRN: [id number]  Chief Complaint  Patient presents with   Hypertension    Patient presents today for a bp and pre dm follow up, Patient reports compliance with medication. Patient denies any chest pain, SOB, or headaches. Patient has no concerns today.     HPI  Discussed the use of AI scribe software for clinical note transcription with the patient, who gave verbal consent to proceed.  History of Present Illness Kevin Mckinney is a 69 year old male with hypertension who presents for a follow-up on his blood pressure management.  His blood pressure is well-controlled with amlodipine  taken once daily. He occasionally experiences lightheadedness when bending down. He has not visited the clinic for a year and needed a prescription refill. There was confusion with his prescription at [facility], but it was clarified that it was sent today.  He notes a higher depression screen score compared to June, attributing mood changes to retirement and changes in income. He describes episodes of crying during movies and a lack of motivation to exercise despite having equipment at home. He is concerned about these symptoms, which he feels are not normal.  He uses Tylenol  PM for back pain, which effectively improves his sleep and alleviates back issues, especially at night.  He recalls being prescribed sildenafil  over a year ago but has not used it recently. He inquires about any new medications available for his condition.  He is retired and lives alone after his son moved to [place]  about nine months ago. He is involved with [employer] and is certified for respite care, providing some income.  Past  Medical History:  Diagnosis Date   Anxiety    Clotting disorder    Hyroids   Depression    Diverticulitis    GERD (gastroesophageal reflux disease)    hx of   Hemorrhoids      Family History  Problem Relation Age of Onset   Throat cancer Father        smoker/drinker   ADD / ADHD Son    Colon polyps Neg Hx    Colon cancer Neg Hx    Esophageal cancer Neg Hx    Rectal cancer Neg Hx    Stomach cancer Neg Hx     Current Medications[1]   Allergies[2]   Review of Systems  Constitutional: Negative.   HENT: Negative.    Eyes: Negative.   Respiratory: Negative.    Cardiovascular: Negative.   Gastrointestinal: Negative.   Neurological: Negative.   Psychiatric/Behavioral: Negative.    All other systems reviewed and are negative.    Today's Vitals   07/03/24 1041  BP: 130/84  Pulse: 91  Temp: 98.4 F (36.9 C)  TempSrc: Oral  Weight: 220 lb (99.8 kg)  Height: 6' (1.829 m)  PainSc: 0-No pain   Body mass index is 29.84 kg/m.  Wt Readings from Last 3 Encounters:  07/03/24 220 lb (99.8 kg)  12/13/23 214 lb 12.8 oz (97.4 kg)  09/19/23 223 lb (101.2 kg)    The 10-year ASCVD risk score (Arnett DK, et al., 2019) is: 18.8%   Values used to calculate the score:     Age: 14 years  Clinically relevant sex: Male     Is Non-Hispanic African American: Yes     Diabetic: No     Tobacco smoker: No     Systolic Blood Pressure: 130 mmHg     Is BP treated: Yes     HDL Cholesterol: 42 mg/dL     Total Cholesterol: 163 mg/dL   Objective:  Physical Exam Vitals and nursing note reviewed.  Constitutional:      General: He is not in acute distress.    Appearance: Normal appearance.  Cardiovascular:     Rate and Rhythm: Normal rate and regular rhythm.     Pulses: Normal pulses.     Heart sounds: Normal heart sounds. No murmur heard. Pulmonary:     Effort: Pulmonary effort is normal. No respiratory distress.     Breath sounds: Normal breath sounds. No wheezing.   Musculoskeletal:        General: No swelling or tenderness. Normal range of motion.  Skin:    General: Skin is warm and dry.     Capillary Refill: Capillary refill takes less than 2 seconds.  Neurological:     General: No focal deficit present.     Mental Status: He is alert and oriented to person, place, and time.     Cranial Nerves: No cranial nerve deficit.     Motor: No weakness.  Psychiatric:        Mood and Affect: Mood normal.        Behavior: Behavior normal.        Thought Content: Thought content normal.        Judgment: Judgment normal.      Assessment And Plan:   Assessment & Plan Essential hypertension Blood pressure well-controlled with current medication. Occasional lightheadedness noted. - Continue current antihypertensive medication regimen. Abnormal glucose Hgba1c is stable. Continue focusing on healthy diet and regular exercise Influenza vaccination declined  Herpes zoster vaccination declined  Overweight with body mass index (BMI) of 29 to 29.9 in adult  Atherosclerosis of aorta Continue statin,  Moderate episode of recurrent major depressive disorder (HCC) Mood score elevated, indicating ongoing depressive symptoms. Discussed medication benefits and referred for further evaluation. - Referred to in-house behavioral health specialist for evaluation and treatment planning. Erectile dysfunction, unspecified erectile dysfunction type Ongoing erectile dysfunction. Discussed mood impact on erectile function. Options remain tadalafil  or Viagra . - Continue current management with tadalafil  as needed.  Orders Placed This Encounter  Procedures   BMP8+eGFR   Hemoglobin A1c   Lipid panel   Ambulatory referral to Integrated Behavioral Health      Return in about 6 months (around 12/31/2024) for phy when able.  Patient was given opportunity to ask questions. Patient verbalized understanding of the plan and was able to repeat key elements of the plan. All  questions were answered to their satisfaction.   Patient and/or legal guardian verbally consented to [facility] services about presenting concerns and psychiatric consultation as appropriate.  The services will be billed as appropriate for the patient   I, [name], FNP, have reviewed all documentation for this visit. The documentation on 07/03/24 for the exam, diagnosis, procedures, and orders are all accurate and complete.   IF YOU HAVE BEEN REFERRED TO A SPECIALIST, IT MAY TAKE 1-2 WEEKS TO SCHEDULE/PROCESS THE REFERRAL. IF YOU HAVE NOT HEARD FROM US /SPECIALIST IN TWO WEEKS, PLEASE GIVE US  A CALL AT [phone].      [1]  Current Outpatient Medications:  psyllium (METAMUCIL SMOOTH TEXTURE) 58.6 % powder, Take 1 packet by mouth 3 (three) times daily., Disp: 283 g, Rfl: 12   tadalafil  (CIALIS ) 20 MG tablet, Take 1 tablet (20 mg total) by mouth as needed for erectile dysfunction., Disp: 20 tablet, Rfl: 1   amLODipine  (NORVASC ) 2.5 MG tablet, Take 1 tablet (2.5 mg total) by mouth daily., Disp: 30 tablet, Rfl: 0   erythromycin  ophthalmic ointment, Place a 1/2 inch ribbon of ointment into the lower eyelid 4 times a day for 3-5 days (Patient not taking: Reported on 07/03/2024), Disp: 3.5 g, Rfl: 0 [2] No Known Allergies

## 2024-07-03 ENCOUNTER — Ambulatory Visit (INDEPENDENT_AMBULATORY_CARE_PROVIDER_SITE_OTHER): Admitting: Nurse Practitioner

## 2024-07-03 ENCOUNTER — Encounter: Payer: Self-pay | Admitting: Nurse Practitioner

## 2024-07-03 VITALS — BP 130/84 | HR 91 | Temp 98.4°F | Ht 72.0 in | Wt 220.0 lb

## 2024-07-03 DIAGNOSIS — I10 Essential (primary) hypertension: Secondary | ICD-10-CM

## 2024-07-03 DIAGNOSIS — F331 Major depressive disorder, recurrent, moderate: Secondary | ICD-10-CM

## 2024-07-03 DIAGNOSIS — Z2821 Immunization not carried out because of patient refusal: Secondary | ICD-10-CM

## 2024-07-03 DIAGNOSIS — R7309 Other abnormal glucose: Secondary | ICD-10-CM

## 2024-07-03 DIAGNOSIS — N529 Male erectile dysfunction, unspecified: Secondary | ICD-10-CM

## 2024-07-03 DIAGNOSIS — Z6829 Body mass index (BMI) 29.0-29.9, adult: Secondary | ICD-10-CM

## 2024-07-03 DIAGNOSIS — I700 Atherosclerosis of aorta: Secondary | ICD-10-CM | POA: Diagnosis not present

## 2024-07-03 DIAGNOSIS — E663 Overweight: Secondary | ICD-10-CM

## 2024-07-03 LAB — BMP8+EGFR
BUN/Creatinine Ratio: 13 (ref 10–24)
BUN: 15 mg/dL (ref 8–27)
CO2: 21 mmol/L (ref 20–29)
Calcium: 9.1 mg/dL (ref 8.6–10.2)
Chloride: 104 mmol/L (ref 96–106)
Creatinine, Ser: 1.15 mg/dL (ref 0.76–1.27)
Glucose: 105 mg/dL — ABNORMAL HIGH (ref 70–99)
Potassium: 4.1 mmol/L (ref 3.5–5.2)
Sodium: 139 mmol/L (ref 134–144)
eGFR: 69 mL/min/1.73

## 2024-07-03 LAB — LIPID PANEL
Chol/HDL Ratio: 3.9 ratio (ref 0.0–5.0)
Cholesterol, Total: 163 mg/dL (ref 100–199)
HDL: 42 mg/dL
LDL Chol Calc (NIH): 103 mg/dL — ABNORMAL HIGH (ref 0–99)
Triglycerides: 97 mg/dL (ref 0–149)
VLDL Cholesterol Cal: 18 mg/dL (ref 5–40)

## 2024-07-03 LAB — HEMOGLOBIN A1C
Est. average glucose Bld gHb Est-mCnc: 117 mg/dL
Hgb A1c MFr Bld: 5.7 % — ABNORMAL HIGH (ref 4.8–5.6)

## 2024-07-03 MED ORDER — AMLODIPINE BESYLATE 2.5 MG PO TABS: 2.5000 mg | ORAL_TABLET | Freq: Every day | ORAL | 0 refills | Status: AC

## 2024-07-14 ENCOUNTER — Ambulatory Visit: Payer: Self-pay | Admitting: Nurse Practitioner

## 2024-07-14 NOTE — Assessment & Plan Note (Signed)
 Continue statin,

## 2024-07-14 NOTE — Assessment & Plan Note (Signed)
 Blood pressure well-controlled with current medication. Occasional lightheadedness noted. - Continue current antihypertensive medication regimen.

## 2024-07-14 NOTE — Assessment & Plan Note (Signed)
 Mood score elevated, indicating ongoing depressive symptoms. Discussed medication benefits and referred for further evaluation. - Referred to in-house behavioral health specialist for evaluation and treatment planning.

## 2024-07-14 NOTE — Assessment & Plan Note (Addendum)
 Hgba1c is stable. Continue focusing on healthy diet and regular exercise

## 2024-12-31 ENCOUNTER — Encounter: Payer: Self-pay | Admitting: Nurse Practitioner
